# Patient Record
Sex: Male | Born: 1990 | Race: White | Hispanic: No | Marital: Single | Smoking: Never smoker
Health system: Southern US, Community
[De-identification: ages and names within clinical notes are randomized; demographics above are authoritative.]

## PROBLEM LIST (undated history)

## (undated) DIAGNOSIS — Z8619 Personal history of other infectious and parasitic diseases: Secondary | ICD-10-CM

## (undated) DIAGNOSIS — Z87442 Personal history of urinary calculi: Secondary | ICD-10-CM

## (undated) DIAGNOSIS — R51 Headache: Secondary | ICD-10-CM

## (undated) DIAGNOSIS — R519 Headache, unspecified: Secondary | ICD-10-CM

## (undated) DIAGNOSIS — G43909 Migraine, unspecified, not intractable, without status migrainosus: Secondary | ICD-10-CM

## (undated) HISTORY — DX: Headache: R51

## (undated) HISTORY — PX: APPENDECTOMY: SHX54

## (undated) HISTORY — DX: Migraine, unspecified, not intractable, without status migrainosus: G43.909

## (undated) HISTORY — DX: Personal history of urinary calculi: Z87.442

## (undated) HISTORY — DX: Personal history of other infectious and parasitic diseases: Z86.19

## (undated) HISTORY — DX: Headache, unspecified: R51.9

## (undated) HISTORY — PX: STOMACH SURGERY: SHX791

---
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
## 2014-10-16 ENCOUNTER — Emergency Department
Admission: EM | Admit: 2014-10-16 | Discharge: 2014-10-17 | Disposition: A | Discharge status: Home / Self Care | Payer: Self-pay | Attending: Emergency Medicine | Admitting: Emergency Medicine

## 2014-10-16 ENCOUNTER — Emergency Department: Payer: Self-pay

## 2014-10-16 DIAGNOSIS — X58XXXA Exposure to other specified factors, initial encounter: Secondary | ICD-10-CM | POA: Insufficient documentation

## 2014-10-16 DIAGNOSIS — Y998 Other external cause status: Secondary | ICD-10-CM | POA: Insufficient documentation

## 2014-10-16 DIAGNOSIS — S8391XA Sprain of unspecified site of right knee, initial encounter: Secondary | ICD-10-CM | POA: Insufficient documentation

## 2014-10-16 DIAGNOSIS — Y9289 Other specified places as the place of occurrence of the external cause: Secondary | ICD-10-CM | POA: Insufficient documentation

## 2014-10-16 DIAGNOSIS — Y9364 Activity, baseball: Secondary | ICD-10-CM | POA: Insufficient documentation

## 2014-10-16 MED ORDER — TRAMADOL HCL 50 MG PO TABS: 50.0000 mg | ORAL_TABLET | Freq: Four times a day (QID) | ORAL | Status: DC | PRN

## 2014-10-16 MED ORDER — IBUPROFEN 800 MG PO TABS: 800.0000 mg | ORAL_TABLET | Freq: Three times a day (TID) | ORAL | Status: DC | PRN

## 2014-10-16 NOTE — Discharge Instructions (Signed)
We Knee Bracing Knee braces are supports to help stabilize and protect an injured or painful knee. They come in many different styles. They should support and protect the knee without increasing the chance of other injuries to yourself or others. It is important not to have a false sense of security when using a brace. Knee braces that help you to keep using your knee:  Do not restore normal knee stability under high stress forces.  May decrease some aspects of athletic performance. Some of the different types of knee braces are:  Prophylactic knee braces are designed to prevent or reduce the severity of knee injuries during sports that make injury to the knee more likely.  Rehabilitative knee braces are designed to allow protected motion of:  Injured knees.  Knees that have been treated with or without surgery. There is no evidence that the use of a supportive knee brace protects the graft following a successful anterior cruciate ligament (ACL) reconstruction. However, braces are sometimes used to:   Protect injured ligaments.  Control knee movement during the initial healing period. They may be used as part of the treatment program for the various injured ligaments or cartilage of the knee including the:  Anterior cruciate ligament.  Medial collateral ligament.  Medial or lateral cartilage (meniscus).  Posterior cruciate ligament.  Lateral collateral ligament. Rehabilitative knee braces are most commonly used:  During crutch-assisted walking right after injury.  During crutch-assisted walking right after surgery to repair the cartilage and/or cruciate ligament injury.  For a short period of time, 2-8 weeks, after the injury or surgery. The value of a rehabilitative brace as opposed to a cast or splint includes the:  Ability to adjust the brace for swelling.  Ability to remove the brace for examinations, icing, or showering.  Ability to allow for movement in a controlled  range of motion. Functional knee braces give support to knees that have already been injured. They are designed to provide stability for the injured knee and provide protection after repair. Functional knee braces may not affect performance much. Lower extremity muscle strengthening, flexibility, and improvement in technique are more important than bracing in treating ligamentous knee injuries. Functional braces are not a substitute for rehabilitation or surgical procedures. Unloader/off-loader braces are designed to provide pain relief in arthritic knees. Patients with wear and tear arthritis from growing old or from an old cartilage injury (osteoarthritis) of the knee, and bowlegged (varus) or knock-knee (valgus) deformities, often develop increased pain in the arthritic side due to increased loading. Unloader/off-loader braces are made to reduce uneven loading in such knees. There is reduction in bowing out movement in bowlegged knees when the correct unloader brace is used. Patients with advanced osteoarthritis or severe varus or valgus alignment problems would not likely benefit from bracing. Patellofemoral braces help the kneecap to move smoothly and well centered over the end of the femur in the knee.  Most people who wear knee braces feel that they help. However, there is a lack of scientific evidence that knee braces are helpful at the level needed for athletic participation to prevent injury. In spite of this, athletes report an increase in knee stability, pain relief, performance improvement, and confidence during athletics when using a brace.  Different knee problems require different knee braces:  Your caregiver may suggest one kind of knee brace after knee surgery.  A caregiver may choose another kind of knee brace for support instead of surgery for some types of torn ligaments.  You  may also need one for pain in the front of your knee that is not getting better with strengthening and  flexibility exercises. Get your caregiver's advice if you want to try a knee brace. The caregiver will advise you on where to get them and provide a prescription when it is needed to fashion and/or fit the brace. Knee braces are the least important part of preventing knee injuries or getting better following injury. Stretching, strengthening and technique improvement are far more important in caring for and preventing knee injuries. When strengthening your knee, increase your activities a little at a time so as not to develop injuries from overuse. Work out an exercise plan with your caregiver and/or physical therapist to get the best program for you. Do not let a knee brace become a crutch. Always remember, there are no braces which support the knee as well as your original ligaments and cartilage you were born with. Conditioning, proper warm-up, and stretching remain the most important parts of keeping your knees healthy. HOW TO USE A KNEE BRACE  During sports, knee braces should be used as directed by your caregiver.  Make sure that the hinges are where the knee bends.  Straps, tapes, or hook-and-loop tapes should be fastened around your leg as instructed.  You should check the placement of the brace during activities to make sure that it has not moved. Poorly positioned braces can hurt rather than help you.  To work well, a knee brace should be worn during all activities that put you at risk of knee injury.  Warm up properly before beginning athletic activities. HOME CARE INSTRUCTIONS  Knee braces often get damaged during normal use. Replace worn-out braces for maximum benefit.  Clean regularly with soap and water.  Inspect your brace often for wear and tear.  Cover exposed metal to protect others from injury.  Durable materials may cost more, but last longer. SEEK IMMEDIATE MEDICAL CARE IF:   Your knee seems to be getting worse rather than better.  You have increasing pain or  swelling in the knee.  You have problems caused by the knee brace.  You have increased swelling or inflammation (redness or soreness) in your knee.  Your knee becomes warm and more painful and you develop an unexplained temperature over 101F (38.3C). MAKE SURE YOU:   Understand these instructions.  Will watch your condition.  Will get help right away if you are not doing well or get worse. See your caregiver, physical therapist, or orthopedic surgeon for additional information. Document Released: 07/14/2003 Document Revised: 09/07/2013 Document Reviewed: 10/20/2008 [facility] Patient Information 2015 [place], [place]. This information is not intended to replace advice given to you by your health care provider. Make sure you discuss any questions you have with your health care provider. ar knee support for 3-5 days as needed.

## 2014-10-16 NOTE — ED Notes (Signed)
Pt reports twisting knee while playing softball tonight. Reports feeling pop in knee. Difficulty ambulating due to pain.

## 2014-10-16 NOTE — ED Provider Notes (Signed)
[facility] Emergency Department Provider Note  ____________________________________________  Time seen: Approximately 10:33 PM  I have reviewed the triage vital signs and the nursing notes.   HISTORY  Chief Complaint Knee Injury    HPI Arthur Mills is a 24 y.o. male complaining of right knee pain secondary to a twisting incident while playing softball today. He stated he felt a popping when he twisted his knee. Patient states pain increases with ambulation. Patient states he is having trouble wiggling his toes. Patient is rating his pain as 8/10. Patient has applied ice but the pain but no other palliative measures. Incident occurred approximately one hour ago.   No past medical history on file.  There are no active problems to display for this patient.   No past surgical history on file.  Current Outpatient Rx  Name  Route  Sig  Dispense  Refill  . ibuprofen (ADVIL,MOTRIN) 800 MG tablet   Oral   Take 1 tablet (800 mg total) by mouth every 8 (eight) hours as needed for moderate pain.   15 tablet   0   . traMADol (ULTRAM) 50 MG tablet   Oral   Take 1 tablet (50 mg total) by mouth every 6 (six) hours as needed for moderate pain.   12 tablet   0     Allergies Review of patient's allergies indicates no known allergies.  No family history on file.  Social History History  Substance Use Topics  . Smoking status: Not on file  . Smokeless tobacco: Not on file  . Alcohol Use: Not on file    Review of Systems Constitutional: No fever/chills Eyes: No visual changes. ENT: No sore throat. Cardiovascular: Denies chest pain. Respiratory: Denies shortness of breath. Gastrointestinal: No abdominal pain.  No nausea, no vomiting.  No diarrhea.  No constipation. Genitourinary: Negative for dysuria. Musculoskeletal: Lateral right knee pain Skin: Negative for rash. Neurological: Negative for headaches, focal weakness or numbness.  10-point ROS  otherwise negative.  ____________________________________________   PHYSICAL EXAM:  VITAL SIGNS: ED Triage Vitals  Enc Vitals Group     BP 10/16/14 2212 121/83 mmHg     Pulse Rate 10/16/14 2212 104     Resp 10/16/14 2212 18     Temp 10/16/14 2212 98.4 F (36.9 C)     Temp Source 10/16/14 2212 Oral     SpO2 10/16/14 2212 97 %     Weight 10/16/14 2212 210 lb (95.255 kg)     Height 10/16/14 2212  (1.88 m)     Head Cir --      Peak Flow --      Pain Score 10/16/14 2212 8     Pain Loc --      Pain Edu? --      Excl. in GC? --     Constitutional: Alert and oriented. Well appearing and in no acute distress. Eyes: Conjunctivae are normal. PERRL. EOMI. Head: Atraumatic. Nose: No congestion/rhinnorhea. Mouth/Throat: Mucous membranes are moist.  Oropharynx non-erythematous. Neck: No stridor. No deformity full nuchal range of motion nontender palpation. Hematological/Lymphatic/Immunilogical: No cervical lymphadenopathy. Cardiovascular: Normal rate, regular rhythm. Grossly normal heart sounds.  Good peripheral circulation of the right lower extremity. Respiratory: Normal respiratory effort.  No retractions. Lungs CTAB. Gastrointestinal: Soft and nontender. No distention. No abdominal bruits. No CVA tenderness. Musculoskeletal: No obvious deformity of the left knee no obvious edema moderate crepitus palpation neurovascular intact free nuchal range of motion nontender repair.  Neurologic:  Normal  speech and language. No gross focal neurologic deficits are appreciated. Speech is normal. Atypical gait secondary to pain.  Skin:  Skin is warm, dry and intact. No rash noted. Psychiatric: Mood and affect are normal. Speech and behavior are normal.  ____________________________________________   LABS (all labs ordered are listed, but only abnormal results are displayed)  Labs Reviewed - No data to  display ____________________________________________  EKG   ____________________________________________  RADIOLOGY  No obvious deformity or fracture ____________________________________________   PROCEDURES  Procedure(s) performed: None  Critical Care performed: No  ____________________________________________   INITIAL IMPRESSION / ASSESSMENT AND PLAN / ED COURSE  Pertinent labs & imaging results that were available during my care of the patient were reviewed by me and considered in my medical decision making (see chart for details).  Sprain right knee. ____________________________________________   FINAL CLINICAL IMPRESSION(S) / ED DIAGNOSES  Final diagnoses:  Sprain, knee, right, initial encounter      Joni Reining, PA-C 10/16/14 2343  Phineas Semen, MD 10/18/14 [phone]

## 2014-10-17 MED ORDER — PHENYLEPHRINE HCL 0.5 % NA SOLN
NASAL | Status: AC
Start: 2014-10-17 — End: 2014-10-17
  Filled 2014-10-17: qty 15

## 2015-06-30 ENCOUNTER — Telehealth: Payer: Self-pay | Admitting: *Deleted

## 2015-06-30 ENCOUNTER — Encounter: Payer: Self-pay | Admitting: *Deleted

## 2015-06-30 NOTE — Telephone Encounter (Signed)
Pre-Visit Call completed with patient and chart updated.   Pre-Visit Info documented in Specialty Comments under [employer].    

## 2015-07-01 ENCOUNTER — Encounter: Payer: Self-pay | Admitting: Physician Assistant

## 2015-07-01 ENCOUNTER — Ambulatory Visit (INDEPENDENT_AMBULATORY_CARE_PROVIDER_SITE_OTHER): Payer: Managed Care, Other (non HMO) | Admitting: Physician Assistant

## 2015-07-01 VITALS — BP 124/60 | HR 117 | Temp 98.2°F | Ht 73.0 in | Wt 214.6 lb

## 2015-07-01 DIAGNOSIS — G8929 Other chronic pain: Secondary | ICD-10-CM | POA: Diagnosis not present

## 2015-07-01 DIAGNOSIS — Z23 Encounter for immunization: Secondary | ICD-10-CM | POA: Diagnosis not present

## 2015-07-01 DIAGNOSIS — M549 Dorsalgia, unspecified: Secondary | ICD-10-CM | POA: Diagnosis not present

## 2015-07-01 DIAGNOSIS — G43709 Chronic migraine without aura, not intractable, without status migrainosus: Secondary | ICD-10-CM

## 2015-07-01 DIAGNOSIS — R000 Tachycardia, unspecified: Secondary | ICD-10-CM

## 2015-07-01 LAB — CBC
HEMATOCRIT: 43.2 % (ref 39.0–52.0)
HEMOGLOBIN: 14.4 g/dL (ref 13.0–17.0)
MCHC: 33.3 g/dL (ref 30.0–36.0)
MCV: 85 fl (ref 78.0–100.0)
PLATELETS: 262 10*3/uL (ref 150.0–400.0)
RBC: 5.08 Mil/uL (ref 4.22–5.81)
RDW: 13.6 % (ref 11.5–15.5)
WBC: 7.5 10*3/uL (ref 4.0–10.5)

## 2015-07-01 LAB — TSH: TSH: 0.97 u[IU]/mL (ref 0.35–4.50)

## 2015-07-01 MED ORDER — TRAMADOL HCL (ER BIPHASIC) 100 MG PO TB24: ORAL_TABLET | ORAL | Status: DC

## 2015-07-01 NOTE — Progress Notes (Signed)
Pre visit review using our clinic review tool, if applicable. No additional management support is needed unless otherwise documented below in the visit note. 

## 2015-07-01 NOTE — Progress Notes (Signed)
Patient presents to clinic today to establish care.  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 Was previously evaluated by Orthopedics who recommended surgery. Patient endorses pain daily, ranked 7/10. Pain does radiate. Denies saddle anesthesia, numbness/tingling/weakness of legs.  Is not taking anything for pain. Would like to be set up with Orthopedics again.  Patient endorses history of migraine headaches described as mild, occurring 1-2 x week since high school after sustaining multiple concussions. Endorses taking OTC migraine medication with relief of symptoms. Denies every taking prescription medication for this issue.  Past Medical History  Diagnosis Date  . History of chicken pox   . Frequent headaches   . History of kidney stones 2012  . Migraine     No current outpatient prescriptions on file prior to visit.   No current facility-administered medications on file prior to visit.    No Known Allergies  Family History  Problem Relation Age of Onset  . Diabetes Paternal Grandmother   . Hypertension Paternal Grandmother   . Stroke Paternal Grandmother   . Alcohol abuse Maternal Uncle     Social History   Social History  . Marital Status: Single    Spouse Name: N/A  . Number of Children: 0  . Years of Education: N/A   Occupational History  . Construction    Social History Main Topics  . Smoking status: Never Smoker   . Smokeless tobacco: Current User    Types: Chew  . Alcohol Use: 0.0 oz/week    0 Standard drinks or equivalent per week     Comment: Occa  . Drug Use: No  . Sexual Activity:    Partners: Female   Other Topics Concern  . None   Social History Narrative   Review of Systems  Constitutional: Negative for fever.  Eyes: Negative for blurred vision and double vision.  Respiratory: Negative for cough and shortness of breath.   Cardiovascular: Negative for chest pain and palpitations.    Musculoskeletal: Positive for back pain and joint pain. Negative for falls.  Neurological: Negative for dizziness, sensory change and loss of consciousness.  Psychiatric/Behavioral: Negative for depression, suicidal ideas, hallucinations and substance abuse. The patient is not nervous/anxious and does not have insomnia.     BP 124/60 mmHg  Pulse 117  Temp(Src) 98.2 F (36.8 C) (Oral)  Ht  (1.854 m)  Wt 214 lb 9.6 oz (97.342 kg)  BMI 28.32 kg/m2  SpO2 98%  Physical Exam  Constitutional: He is oriented to person, place, and time and well-developed, well-nourished, and in no distress.  HENT:  Head: Normocephalic and atraumatic.  Right Ear: External ear normal.  Left Ear: External ear normal.  Nose: Nose normal.  Mouth/Throat: Oropharynx is clear and moist. No oropharyngeal exudate.  TM within normal limits bilaterally  Eyes: Conjunctivae are normal. Pupils are equal, round, and reactive to light.  Neck: Neck supple. No thyromegaly present.  Cardiovascular: Regular rhythm, normal heart sounds and intact distal pulses.   Slightly tachycardic  Pulmonary/Chest: Effort normal and breath sounds normal. No respiratory distress. He has no wheezes. He has no rales. He exhibits no tenderness.  Musculoskeletal:       Cervical back: Normal.       Thoracic back: He exhibits pain. He exhibits normal range of motion, no tenderness, no bony tenderness and no spasm.       Lumbar back: Normal.  Neurological: He is alert and oriented to person,  place, and time.  Skin: Skin is warm and dry. No rash noted.  Psychiatric: Affect normal.  Vitals reviewed.   Assessment/Plan: Chronic migraine without aura without status migrainosus, not intractable 1-2 per week. Mild. Discussed preventative medication giving chronicity of symptoms along with history of mild elevated heart rate. Patient will give some thought to this.  Chronic back pain Referral to Orthopedic surgery placed. Patient to get copies  of old records. Rx Tramadol ER to take daily. Follow-up as scheduled.  Tachycardia Suspect secondary to pain and anxiety at first visit. Asymptomatic. Will check TSH today.

## 2015-07-01 NOTE — Patient Instructions (Signed)
Please go to the lab for blood work. I will call you with your results.  Please take the Tramadol ER daily as directed. You will be contacted for an appointment with Orthopedics.  Please consider letting us start a medication like Propranolol for your migraines to prevent them.  Follow-up 1 month.

## 2015-07-03 DIAGNOSIS — G43709 Chronic migraine without aura, not intractable, without status migrainosus: Secondary | ICD-10-CM | POA: Insufficient documentation

## 2015-07-03 DIAGNOSIS — R000 Tachycardia, unspecified: Secondary | ICD-10-CM | POA: Insufficient documentation

## 2015-07-03 DIAGNOSIS — M549 Dorsalgia, unspecified: Principal | ICD-10-CM

## 2015-07-03 DIAGNOSIS — G8929 Other chronic pain: Secondary | ICD-10-CM | POA: Insufficient documentation

## 2015-07-03 NOTE — Assessment & Plan Note (Signed)
Suspect secondary to pain and anxiety at first visit. Asymptomatic. Will check TSH today.

## 2015-07-03 NOTE — Assessment & Plan Note (Signed)
1-2 per week. Mild. Discussed preventative medication giving chronicity of symptoms along with history of mild elevated heart rate. Patient will give some thought to this.

## 2015-07-03 NOTE — Assessment & Plan Note (Signed)
Referral to Orthopedic surgery placed. Patient to get copies of old records. Rx Tramadol ER to take daily. Follow-up as scheduled.

## 2015-07-04 ENCOUNTER — Encounter: Payer: Self-pay | Admitting: *Deleted

## 2015-07-29 ENCOUNTER — Telehealth: Payer: Self-pay | Admitting: Physician Assistant

## 2015-07-29 ENCOUNTER — Ambulatory Visit: Payer: Managed Care, Other (non HMO) | Admitting: Physician Assistant

## 2015-08-02 ENCOUNTER — Encounter: Payer: Self-pay | Admitting: Physician Assistant

## 2015-08-02 NOTE — Telephone Encounter (Signed)
No charge for 1st no-show 

## 2015-08-02 NOTE — Telephone Encounter (Signed)
Waiving fee, mailing reminder letter °

## 2015-08-02 NOTE — Telephone Encounter (Signed)
Pt was no show 07/29/15 for follow up appt, pt has rescheduled for 08/05/15, Marj do you remember if he gave a reason for no show? 1st no show Charge or no charge?

## 2015-08-05 ENCOUNTER — Ambulatory Visit (INDEPENDENT_AMBULATORY_CARE_PROVIDER_SITE_OTHER): Payer: Managed Care, Other (non HMO) | Admitting: Physician Assistant

## 2015-08-05 ENCOUNTER — Encounter: Payer: Self-pay | Admitting: Physician Assistant

## 2015-08-05 VITALS — BP 100/70 | HR 77 | Temp 98.7°F | Ht 73.0 in | Wt 215.4 lb

## 2015-08-05 DIAGNOSIS — G8929 Other chronic pain: Secondary | ICD-10-CM

## 2015-08-05 DIAGNOSIS — M549 Dorsalgia, unspecified: Secondary | ICD-10-CM | POA: Diagnosis not present

## 2015-08-05 MED ORDER — MELOXICAM 15 MG PO TABS: 15.0000 mg | ORAL_TABLET | Freq: Every day | ORAL | Status: DC

## 2015-08-05 NOTE — Progress Notes (Signed)
Patient presents to clinic today for follow-up of low back pain. Has seen Orthopedics this morning. Endorses having x-ray obtained that was negative for arthritic changes but specialist is worried about a herniated disc. Is scheduled for MRI next week. Endorses trying to take Tramadol as directed without any relief in symptoms. Has stopped taking. Denies new or worsening symptoms. Pain is a 4/10 currently.  Past Medical History  Diagnosis Date  . History of chicken pox   . Frequent headaches   . History of kidney stones 2012  . Migraine     No current outpatient prescriptions on file prior to visit.   No current facility-administered medications on file prior to visit.    No Known Allergies  Family History  Problem Relation Age of Onset  . Diabetes Paternal Grandmother   . Hypertension Paternal Grandmother   . Stroke Paternal Grandmother   . Alcohol abuse Maternal Uncle     Social History   Social History  . Marital Status: Single    Spouse Name: N/A  . Number of Children: 0  . Years of Education: N/A   Occupational History  . Construction    Social History Main Topics  . Smoking status: Never Smoker   . Smokeless tobacco: Current User    Types: Chew  . Alcohol Use: 0.0 oz/week    0 Standard drinks or equivalent per week     Comment: Occa  . Drug Use: No  . Sexual Activity:    Partners: Female   Other Topics Concern  . None   Social History Narrative    Review of Systems - See HPI.  All other ROS are negative.  BP 100/70 mmHg  Pulse 77  Temp(Src) 98.7 F (37.1 C) (Oral)  Ht  (1.854 m)  Wt 215 lb 6.4 oz (97.705 kg)  BMI 28.42 kg/m2  SpO2 98%  Physical Exam  Constitutional: He is oriented to person, place, and time and well-developed, well-nourished, and in no distress.  HENT:  Head: Normocephalic and atraumatic.  Eyes: Conjunctivae are normal.  Cardiovascular: Normal rate, regular rhythm, normal heart sounds and intact distal pulses.     Pulmonary/Chest: Effort normal and breath sounds normal. No respiratory distress. He has no wheezes. He has no rales. He exhibits no tenderness.  Musculoskeletal:       Lumbar back: He exhibits tenderness. He exhibits no bony tenderness.  Neurological: He is alert and oriented to person, place, and time.  Skin: Skin is warm and dry. No rash noted.  Psychiatric: Affect normal.  Vitals reviewed.   Recent Results (from the past 2160 hour(s))  TSH     Status: None   Collection Time: 07/01/15  1:56 PM  Result Value Ref Range   TSH 0.97 0.35 - 4.50 uIU/mL  CBC     Status: None   Collection Time: 07/01/15  1:56 PM  Result Value Ref Range   WBC 7.5 4.0 - 10.5 K/uL   RBC 5.08 4.22 - 5.81 Mil/uL   Platelets 262.0 150.0 - 400.0 K/uL   Hemoglobin 14.4 13.0 - 17.0 g/dL   HCT 69.6 29.5 - 28.4 %   MCV 85.0 78.0 - 100.0 fl   MCHC 33.3 30.0 - 36.0 g/dL   RDW 13.2 44.0 - 10.2 %    Assessment/Plan: Chronic back pain Follow-up with Orthopedics as scheduled for MRI and further management. Will attempt trial of Meloxicam to see if NSAID offers better relief of pain as patient wanting to avoid  use of Norco.

## 2015-08-05 NOTE — Patient Instructions (Signed)
Please take the Meloxicam once daily as directed to see if this helps with pain. Follow-up with Orthopedics next week as scheduled for an MRI.  Follow-up with me as needed.

## 2015-08-05 NOTE — Assessment & Plan Note (Signed)
Follow-up with Orthopedics as scheduled for MRI and further management. Will attempt trial of Meloxicam to see if NSAID offers better relief of pain as patient wanting to avoid use of Norco.

## 2015-08-05 NOTE — Progress Notes (Signed)
Pre visit review using our clinic review tool, if applicable. No additional management support is needed unless otherwise documented below in the visit note. 

## 2015-09-14 ENCOUNTER — Encounter (HOSPITAL_BASED_OUTPATIENT_CLINIC_OR_DEPARTMENT_OTHER): Payer: Self-pay | Admitting: Emergency Medicine

## 2015-09-14 ENCOUNTER — Emergency Department (HOSPITAL_BASED_OUTPATIENT_CLINIC_OR_DEPARTMENT_OTHER)
Admission: EM | Admit: 2015-09-14 | Discharge: 2015-09-14 | Disposition: A | Discharge status: Home / Self Care | Payer: Managed Care, Other (non HMO) | Attending: Emergency Medicine | Admitting: Emergency Medicine

## 2015-09-14 DIAGNOSIS — Y929 Unspecified place or not applicable: Secondary | ICD-10-CM | POA: Insufficient documentation

## 2015-09-14 DIAGNOSIS — W298XXA Contact with other powered powered hand tools and household machinery, initial encounter: Secondary | ICD-10-CM | POA: Insufficient documentation

## 2015-09-14 DIAGNOSIS — Y9389 Activity, other specified: Secondary | ICD-10-CM | POA: Insufficient documentation

## 2015-09-14 DIAGNOSIS — Z720 Tobacco use: Secondary | ICD-10-CM | POA: Insufficient documentation

## 2015-09-14 DIAGNOSIS — S61219A Laceration without foreign body of unspecified finger without damage to nail, initial encounter: Secondary | ICD-10-CM

## 2015-09-14 DIAGNOSIS — S61212A Laceration without foreign body of right middle finger without damage to nail, initial encounter: Secondary | ICD-10-CM | POA: Insufficient documentation

## 2015-09-14 DIAGNOSIS — Y999 Unspecified external cause status: Secondary | ICD-10-CM | POA: Insufficient documentation

## 2015-09-14 MED ORDER — LIDOCAINE HCL 2 % IJ SOLN
20.0000 mL | Freq: Once | INTRAMUSCULAR | Status: DC
  Filled 2015-09-14: qty 20

## 2015-09-14 NOTE — ED Notes (Signed)
Patient reports that he cut his right middle finger on a drill

## 2015-09-14 NOTE — ED Notes (Signed)
Patient seen and treated by PA - wound is sutured prior to this RN's ability to assess.

## 2015-09-14 NOTE — ED Provider Notes (Signed)
CSN: [id number]     Arrival date & time 09/14/15  1312 History   First MD Initiated Contact with Patient 09/14/15 1404     Chief Complaint  Patient presents with  . Finger Injury     (Consider location/radiation/quality/duration/timing/severity/associated sxs/prior Treatment) HPI Comments: Patient presents with complaint of laceration to the right long finger sustained while working today. Patient was using a drill with this finger and reports that the skin tore back. Patient rinsed the wound and applied a dressing with gauze and duct tape. He reports some bleeding at the time of injury. Last tetanus was 4 years ago. Patient denies other injury. Onset of symptoms acute. Course is constant. Nothing makes symptoms better or worse.  The history is provided by the patient.    Past Medical History  Diagnosis Date  . History of chicken pox   . Frequent headaches   . History of kidney stones 2012  . Migraine    Past Surgical History  Procedure Laterality Date  . Stomach surgery  "middle school'    repair of "malrotation of stomach"  . Appendectomy     Family History  Problem Relation Age of Onset  . Diabetes Paternal Grandmother   . Hypertension Paternal Grandmother   . Stroke Paternal Grandmother   . Alcohol abuse Maternal Uncle    Social History  Substance Use Topics  . Smoking status: Never Smoker   . Smokeless tobacco: Current User    Types: Chew  . Alcohol Use: 0.0 oz/week    0 Standard drinks or equivalent per week     Comment: Occa    Review of Systems  Constitutional: Negative for activity change.  Musculoskeletal: Negative for back pain, joint swelling, arthralgias, gait problem and neck pain.  Skin: Positive for wound.  Neurological: Negative for weakness and numbness.      Allergies  Review of patient's allergies indicates no known allergies.  Home Medications   Prior to Admission medications   Medication Sig Start Date End Date Taking? Authorizing  Provider  meloxicam (MOBIC) 15 MG tablet Take 1 tablet (15 mg total) by mouth daily. 08/05/15   Waldon MerlWilliam C Martin, PA-C   BP 142/71 mmHg  Pulse 94  Temp(Src) 98.6 F (37 C) (Oral)  Resp 18  Ht 6\' 2"  (1.88 m)  Wt 92.987 kg  BMI 26.31 kg/m2  SpO2 100% Physical Exam  Constitutional: He appears well-developed and well-nourished.  HENT:  Head: Normocephalic and atraumatic.  Eyes: Conjunctivae are normal.  Neck: Normal range of motion. Neck supple.  Cardiovascular: Normal pulses.   Musculoskeletal: He exhibits tenderness. He exhibits no edema.       Right hand: He exhibits normal range of motion and no tenderness. Normal sensation noted.       Hands: Patient with a 2 cm total length laceration to the pad of the right long finger. Wound is C-shaped. Skin flap appears to be perfused. Wound explored and flap contains epidermis only. Base of wound clean without significant bleeding. No foreign bodies noted. Patient able to fully flex finger at the DIP and PIP joints.  Neurological: He is alert. No sensory deficit.  Motor, sensation, and vascular distal to the injury is fully intact.   Skin: Skin is warm and dry.  Psychiatric: He has a normal mood and affect.  Nursing note and vitals reviewed.   ED Course  Procedures (including critical care time)  Patient seen and examined.   Vital signs reviewed and are as follows: BP 142/71  mmHg  Pulse 94  Temp(Src) 98.6 F (37 C) (Oral)  Resp 18  Ht  (1.88 m)  Wt 92.987 kg  BMI 26.31 kg/m2  SpO2 100%  Digital block performed Technique: single injection into base of finger into flexor tendon sheath Finger: Right long Area prepped with alcohol wipe and iodine Medication: mL of 2% lidocaine without epinephrine Patient tolerated procedure well. Adequate anesthesia achieved.   LACERATION REPAIR Performed by: Carolee Rota, Horcher PA-S2 Authorized by: Carolee Rota Consent: Verbal consent obtained. Risks and benefits: risks,  benefits and alternatives were discussed Consent given by: patient Patient identity confirmed: provided demographic data Prepped and Draped in normal sterile fashion Wound explored  Laceration Location: R long  Laceration Length: 2cm, c-shaped flap  No Foreign Bodies seen or palpated  Anesthesia: digital block (see above)  Irrigation method: skin scrub Amount of cleaning: standard  Skin closure: 6-0 Ethilon  Number of sutures: 7  Technique: simple interrupted  Patient tolerance: Patient tolerated the procedure well with no immediate complications.   Patient counseled on wound care. Patient counseled on need to return or see PCP/urgent care for suture removal in 7 days. Patient was urged to return to the Emergency Department urgently with worsening pain, swelling, expanding erythema especially if it streaks away from the affected area, fever, or if they have any other concerns. Patient verbalized understanding.    MDM   Final diagnoses:  Finger laceration, initial encounter   Finger lac: Flap laceration involving epidermis. Wound flap tacked down to protect area. Unclear if this area will eventually need to be debrided. No signs of infection at this point. Wound is clean without concern for foreign body. No flexor tendon involvement is suspected.   Renne Crigler, PA-C 09/14/15 1646  Vanetta Mulders, MD 09/20/15 [phone]

## 2015-09-14 NOTE — Discharge Instructions (Signed)
Please read and follow all provided instructions.  Your diagnoses today include:  1. Finger laceration, initial encounter    Tests performed today include:  Vital signs. See below for your results today.   Medications prescribed:   None  Take any prescribed medications only as directed.   Home care instructions:  Follow any educational materials and wound care instructions contained in this packet.   Keep affected area above the level of your heart when possible to minimize swelling. Wash area gently twice a day with warm soapy water. Do not apply alcohol or hydrogen peroxide. Cover the area if it draining or weeping.   Follow-up instructions: Suture Removal: Return to the Emergency Department or see your primary care care doctor in 7 days for a recheck of your wound and removal of your sutures or staples.    Return instructions:  Return to the Emergency Department if you have:  Fever  Worsening pain  Worsening swelling of the wound  Pus draining from the wound  Redness of the skin that moves away from the wound, especially if it streaks away from the affected area   Any other emergent concerns  Your vital signs today were: BP 142/71 mmHg   Pulse 94   Temp(Src) 98.6 F (37 C) (Oral)   Resp 18   Ht 6\' 2"  (1.88 m)   Wt 92.987 kg   BMI 26.31 kg/m2   SpO2 100% If your blood pressure (BP) was elevated above 135/85 this visit, please have this repeated by your doctor within one month. --------------

## 2015-09-14 NOTE — ED Notes (Signed)
Finger splint applied, pateint with good CMS post application. The patient given instructions about splint management and wound care.

## 2016-02-16 ENCOUNTER — Emergency Department (HOSPITAL_BASED_OUTPATIENT_CLINIC_OR_DEPARTMENT_OTHER)
Admission: EM | Admit: 2016-02-16 | Discharge: 2016-02-16 | Disposition: A | Discharge status: Home / Self Care | Payer: 59 | Attending: Emergency Medicine | Admitting: Emergency Medicine

## 2016-02-16 ENCOUNTER — Encounter (HOSPITAL_BASED_OUTPATIENT_CLINIC_OR_DEPARTMENT_OTHER): Payer: Self-pay

## 2016-02-16 ENCOUNTER — Emergency Department (HOSPITAL_BASED_OUTPATIENT_CLINIC_OR_DEPARTMENT_OTHER): Payer: 59

## 2016-02-16 DIAGNOSIS — J029 Acute pharyngitis, unspecified: Secondary | ICD-10-CM | POA: Diagnosis present

## 2016-02-16 DIAGNOSIS — J36 Peritonsillar abscess: Secondary | ICD-10-CM | POA: Insufficient documentation

## 2016-02-16 DIAGNOSIS — F17220 Nicotine dependence, chewing tobacco, uncomplicated: Secondary | ICD-10-CM | POA: Insufficient documentation

## 2016-02-16 LAB — I-STAT CHEM 8, ED
BUN: 11 mg/dL (ref 6–20)
CHLORIDE: 98 mmol/L — AB (ref 101–111)
CREATININE: 1.1 mg/dL (ref 0.61–1.24)
Calcium, Ion: 1.18 mmol/L (ref 1.15–1.40)
GLUCOSE: 92 mg/dL (ref 65–99)
HCT: 46 % (ref 39.0–52.0)
HEMOGLOBIN: 15.6 g/dL (ref 13.0–17.0)
Potassium: 4.2 mmol/L (ref 3.5–5.1)
Sodium: 137 mmol/L (ref 135–145)
TCO2: 27 mmol/L (ref 0–100)

## 2016-02-16 LAB — CBC WITH DIFFERENTIAL/PLATELET
Basophils Absolute: 0 10*3/uL (ref 0.0–0.1)
Basophils Relative: 0 %
EOS PCT: 0 %
Eosinophils Absolute: 0 10*3/uL (ref 0.0–0.7)
HCT: 42.7 % (ref 39.0–52.0)
Hemoglobin: 14 g/dL (ref 13.0–17.0)
LYMPHS PCT: 10 %
Lymphs Abs: 1.4 10*3/uL (ref 0.7–4.0)
MCH: 29 pg (ref 26.0–34.0)
MCHC: 32.8 g/dL (ref 30.0–36.0)
MCV: 88.4 fL (ref 78.0–100.0)
MONO ABS: 1.8 10*3/uL — AB (ref 0.1–1.0)
MONOS PCT: 13 %
NEUTROS ABS: 11.1 10*3/uL — AB (ref 1.7–7.7)
Neutrophils Relative %: 77 %
PLATELETS: 279 10*3/uL (ref 150–400)
RBC: 4.83 MIL/uL (ref 4.22–5.81)
RDW: 12.9 % (ref 11.5–15.5)
WBC: 14.3 10*3/uL — ABNORMAL HIGH (ref 4.0–10.5)

## 2016-02-16 MED ORDER — ONDANSETRON HCL 4 MG/2ML IJ SOLN
INTRAMUSCULAR | Status: AC
  Filled 2016-02-16: qty 2

## 2016-02-16 MED ORDER — ONDANSETRON HCL 4 MG/2ML IJ SOLN
4.0000 mg | Freq: Once | INTRAMUSCULAR | Status: AC
  Administered 2016-02-16: 4 mg via INTRAVENOUS

## 2016-02-16 MED ORDER — CLINDAMYCIN HCL 150 MG PO CAPS: 300.0000 mg | ORAL_CAPSULE | Freq: Four times a day (QID) | ORAL | 0 refills | Status: DC

## 2016-02-16 MED ORDER — DIPHENHYDRAMINE HCL 50 MG/ML IJ SOLN
INTRAMUSCULAR | Status: AC
  Filled 2016-02-16: qty 1

## 2016-02-16 MED ORDER — FAMOTIDINE IN NACL 20-0.9 MG/50ML-% IV SOLN
20.0000 mg | Freq: Once | INTRAVENOUS | Status: AC
  Administered 2016-02-16: 20 mg via INTRAVENOUS
  Filled 2016-02-16: qty 50

## 2016-02-16 MED ORDER — DEXAMETHASONE SODIUM PHOSPHATE 10 MG/ML IJ SOLN
10.0000 mg | Freq: Once | INTRAMUSCULAR | Status: AC
  Administered 2016-02-16: 10 mg via INTRAVENOUS
  Filled 2016-02-16: qty 1

## 2016-02-16 MED ORDER — SODIUM CHLORIDE 0.9 % IV BOLUS (SEPSIS)
1000.0000 mL | Freq: Once | INTRAVENOUS | Status: AC
  Administered 2016-02-16: 1000 mL via INTRAVENOUS

## 2016-02-16 MED ORDER — DIPHENHYDRAMINE HCL 50 MG/ML IJ SOLN
50.0000 mg | Freq: Once | INTRAMUSCULAR | Status: AC
  Administered 2016-02-16: 50 mg via INTRAVENOUS

## 2016-02-16 MED ORDER — CLINDAMYCIN PHOSPHATE 600 MG/50ML IV SOLN
600.0000 mg | Freq: Once | INTRAVENOUS | Status: AC
  Administered 2016-02-16: 600 mg via INTRAVENOUS
  Filled 2016-02-16: qty 50

## 2016-02-16 MED ORDER — IOPAMIDOL (ISOVUE-300) INJECTION 61%
100.0000 mL | Freq: Once | INTRAVENOUS | Status: AC | PRN
  Administered 2016-02-16: 75 mL via INTRAVENOUS

## 2016-02-16 NOTE — ED Triage Notes (Signed)
C/o sore throat x 5 days-NAD-steady gait 

## 2016-02-16 NOTE — ED Notes (Signed)
Patient transported to CT 

## 2016-02-16 NOTE — ED Notes (Signed)
Note for work and Rx for clindamycin given. Pt verbalizes understanding to go directly to Dr. Jenne PaneBates office and to remain NPO. Family present to drive. Pt ambulatory to d/c window with steady gait.

## 2016-02-16 NOTE — ED Notes (Signed)
This rn called to radiology by Fleet Contrasachel, radiology tech, pt vomiting, when this rn assesses pt redness and hives noted to back and arms. Pt denies any sob, taken directly back to room in ED and md alerted, rn at bedside with benadryl and zofran on arrival to room. Per Fleet Contrasachel, radiology tech, pt received 47cc isovue 300.

## 2016-02-16 NOTE — ED Notes (Signed)
Pt resting more comfortably now. sts feels better; denies significant change in throat swelling.

## 2016-02-16 NOTE — ED Provider Notes (Signed)
[facility]EMERGENCY DEPT Provider Note   CSN: [id number] Arrival date & time: 02/16/16  1135     History   Chief Complaint Chief Complaint  Patient presents with  . Sore Throat    HPI Arthur Mills is a 25 y.o. male.  The history is provided by the patient. No language interpreter was used.  Sore Throat    Arthur Mills is a 25 y.o. male who presents to the Emergency Department complaining of sore throat. He reports 4 days of sore throat on the right side and pain with swallowing. He has associated swelling to the right side of his face. He has night sweats but no reported fevers. No cough. He is able to swallow water and handle his secretions. No shortness of breath. Symptoms are moderate to severe, constant, worsening. He notes a change in his voice since yesterday. Past Medical History:  Diagnosis Date  . Frequent headaches   . History of chicken pox   . History of kidney stones 2012  . Migraine     Patient Active Problem List   Diagnosis Date Noted  . Chronic back pain 07/03/2015  . Tachycardia 07/03/2015  . Chronic migraine without aura without status migrainosus, not intractable 07/03/2015    Past Surgical History:  Procedure Laterality Date  . APPENDECTOMY    . STOMACH SURGERY  "middle school'   repair of "malrotation of stomach"       Home Medications    Prior to Admission medications   Medication Sig Start Date End Date Taking? Authorizing Provider  clindamycin (CLEOCIN) 150 MG capsule Take 2 capsules (300 mg total) by mouth 4 (four) times daily. 02/16/16   Tilden Fossa, MD    Family History Family History  Problem Relation Age of Onset  . Diabetes Paternal Grandmother   . Hypertension Paternal Grandmother   . Stroke Paternal Grandmother   . Alcohol abuse Maternal Uncle     Social History Social History  Substance Use Topics  . Smoking status: Never Smoker  . Smokeless tobacco: Current User    Types: Chew  . Alcohol use Yes     Comment:  occ     Allergies   Isovue [iopamidol]   Review of Systems Review of Systems  All other systems reviewed and are negative.    Physical Exam Updated Vital Signs BP 126/72   Pulse 97   Temp 99.2 F (37.3 C)   Resp 16   Ht 6\' 1"  (1.854 m)   Wt 215 lb (97.5 kg)   SpO2 100%   BMI 28.37 kg/m   Physical Exam  Constitutional: He is oriented to person, place, and time. He appears well-developed and well-nourished.  HENT:  Head: Normocephalic and atraumatic.  Right Ear: External ear normal.  Left Ear: External ear normal.  Erythema and edema to the posterior oropharynx, significantly to the right tonsillar pillar.  Neck: Neck supple.  Slightly muffled voice  Cardiovascular: Normal rate and regular rhythm.   No murmur heard. Pulmonary/Chest: Effort normal and breath sounds normal. No stridor. No respiratory distress.  Abdominal: Soft. There is no tenderness. There is no rebound and no guarding.  Musculoskeletal: He exhibits no edema or tenderness.  Lymphadenopathy:    He has cervical adenopathy.  Neurological: He is alert and oriented to person, place, and time.  Skin: Skin is warm and dry.  Psychiatric: He has a normal mood and affect. His behavior is normal.  Nursing note and vitals reviewed.    ED Treatments /  Results  Labs (all labs ordered are listed, but only abnormal results are displayed) Labs Reviewed  CBC WITH DIFFERENTIAL/PLATELET - Abnormal; Notable for the following:       Result Value   WBC 14.3 (*)    Neutro Abs 11.1 (*)    Monocytes Absolute 1.8 (*)    All other components within normal limits  I-STAT CHEM 8, ED - Abnormal; Notable for the following:    Chloride 98 (*)    All other components within normal limits    EKG  EKG Interpretation None       Radiology Ct Soft Tissue Neck Wo Contrast  Result Date: 02/16/2016 CLINICAL DATA:  Sore throat.  Fever and elevated white blood count. EXAM: CT NECK WITHOUT CONTRAST TECHNIQUE:  Multidetector CT imaging of the neck was performed following the standard protocol without intravenous contrast. COMPARISON:  None. FINDINGS: The patient received approximately 47 mL of Isovue-300 and immediately began vomiting and had hives. Emergency room was called to the patient was transported back to the ED for treatment of allergic reaction. In retrospect, the patient remembers having previous allergic reaction to intravenous contrast material. The patient was scanned one hour after contrast administration which time there is no significant intravascular contrast present. Pharynx and larynx: Enlargement of the right tonsil measuring approximately 3 cm. Without intravenous contrast, it is difficult determine if this is an abscess or phlegmon or tumor. Given the history this may represent a peritonsillar abscess. This has homogeneous density on unenhanced scanning. Left tonsil normal. Airway mildly deviated to the left without significant compromise. Base of tongue normal. Epiglottis and vocal cords normal. Proximal esophagus normal. Salivary glands: Negative Thyroid: Negative Lymph nodes: Multiple enlarged lymph nodes especially on the right. Right level 2 lymph nodes 17 mm and 16 mm. Posterior right lymph node 9 mm. Left level 2 lymph node 6 mm. Small posterior lymph nodes bilaterally. Vascular: Vascular patency not evaluated without intravenous contrast. Limited intracranial: Negative Visualized orbits: Negative Mastoids and visualized paranasal sinuses: Negative Skeleton: Negative Upper chest: Negative Other: None IMPRESSION: Enlargement of the right tonsil. Lack of intravenous contrast limits differential diagnosis. Given the history this may represent a peritonsillar abscess. Non liquified infection of the tonsil and tumor are in the differential. Enlarged right level 2 lymph nodes which may be related to tonsillar infection or less likely neoplasm given the history. The patient had an allergic reaction  to contrast and was not scanned for proximally 1 hour. This is essentially an unenhanced study. These results were called by telephone at the time of interpretation on 02/16/2016 at 2:39 pm to Dr. Tilden FossaELIZABETH Marty Uy , who verbally acknowledged these results. Electronically Signed   By: Marlan Palauharles  Clark M.D.   On: 02/16/2016 14:40    Procedures Procedures (including critical care time)  Medications Ordered in ED Medications  dexamethasone (DECADRON) injection 10 mg (10 mg Intravenous Given 02/16/16 0000)  clindamycin (CLEOCIN) IVPB 600 mg (0 mg Intravenous Stopped 02/16/16 1447)  sodium chloride 0.9 % bolus 1,000 mL (0 mLs Intravenous Stopped 02/16/16 1518)  iopamidol (ISOVUE-300) 61 % injection 100 mL (75 mLs Intravenous Contrast Given 02/16/16 1303)  ondansetron (ZOFRAN) injection 4 mg (4 mg Intravenous Given 02/16/16 1315)  diphenhydrAMINE (BENADRYL) injection 50 mg (50 mg Intravenous Given 02/16/16 1318)  famotidine (PEPCID) IVPB 20 mg premix (0 mg Intravenous Stopped 02/16/16 1409)     Initial Impression / Assessment and Plan / ED Course  I have reviewed the triage vital signs and the nursing notes.  Pertinent labs & imaging results that were available during my care of the patient were reviewed by me and considered in my medical decision making (see chart for details).  Clinical Course    Patient here for evaluation of sore throat. Examination is concerning for peritonsillar abscess. He does not have any stridor on examination and is able to handle his secretions. Attempted to image with CT soft tissue neck with IV contrast. Patient developed immediate nausea, vomiting and urticaria. He was treated with Benadryl, Pepcid. He received Decadron prior to the scan for his oral swelling. His nausea and vomiting quickly resolved and his urticaria continued to improve during his ED stay. Discussed with Dr. Jenne Pane with ENT who will see the patient in the office today for further treatment of his  peritonsillar abscess.  Final Clinical Impressions(s) / ED Diagnoses   Final diagnoses:  Peritonsillar abscess    New Prescriptions Discharge Medication List as of 02/16/2016  3:18 PM    START taking these medications   Details  clindamycin (CLEOCIN) 150 MG capsule Take 2 capsules (300 mg total) by mouth 4 (four) times daily., Starting Thu 02/16/2016, Print         Tilden Fossa, MD 02/17/16 [phone]

## 2016-02-16 NOTE — ED Notes (Signed)
MD at bedside. 

## 2017-02-18 ENCOUNTER — Emergency Department (HOSPITAL_BASED_OUTPATIENT_CLINIC_OR_DEPARTMENT_OTHER)
Admission: EM | Admit: 2017-02-18 | Discharge: 2017-02-18 | Disposition: A | Discharge status: Home / Self Care | Payer: 59 | Attending: Emergency Medicine | Admitting: Emergency Medicine

## 2017-02-18 ENCOUNTER — Encounter (HOSPITAL_BASED_OUTPATIENT_CLINIC_OR_DEPARTMENT_OTHER): Payer: Self-pay | Admitting: *Deleted

## 2017-02-18 DIAGNOSIS — F17220 Nicotine dependence, chewing tobacco, uncomplicated: Secondary | ICD-10-CM | POA: Insufficient documentation

## 2017-02-18 DIAGNOSIS — G8929 Other chronic pain: Secondary | ICD-10-CM | POA: Insufficient documentation

## 2017-02-18 DIAGNOSIS — M545 Low back pain: Secondary | ICD-10-CM | POA: Diagnosis present

## 2017-02-18 DIAGNOSIS — M5442 Lumbago with sciatica, left side: Secondary | ICD-10-CM | POA: Diagnosis not present

## 2017-02-18 MED ORDER — METHYLPREDNISOLONE 4 MG PO TBPK: ORAL_TABLET | ORAL | 0 refills | Status: DC

## 2017-02-18 MED ORDER — CYCLOBENZAPRINE HCL 10 MG PO TABS: 10.0000 mg | ORAL_TABLET | Freq: Two times a day (BID) | ORAL | 0 refills | Status: DC | PRN

## 2017-02-18 NOTE — Discharge Instructions (Signed)
Please read attached information regarding your condition and back exercises. Take steroids in tapered dose pack as directed. Take Flexeril as needed for spasms. Apply heat to affected area as directed. Follow-up with your PCP for further evaluation. Return to ED for worsening pain, numbness in legs, trouble with urination, fevers, injuries or falls.

## 2017-02-18 NOTE — ED Triage Notes (Signed)
Pt reports right side low back pain radiating into right leg since last Wed. Reports pain started after lifting at work

## 2017-02-18 NOTE — ED Provider Notes (Signed)
[facility] EMERGENCY DEPARTMENT Provider Note   CSN: [id number] Arrival date & time: 02/18/17  1736     History   Chief Complaint Chief Complaint  Patient presents with  . Back Pain    HPI Arthur Mills is a 26 y.o. male.  HPI  Patient presents to ED for evaluation of bilateral low back pain that began 5 days ago. He states that he was at work when he lifted a heavy box after which the pain began. He reports history of similar symptoms in the past due to muscle strain. He has tried ibuprofen with mild relief in symptoms. He reports no improvement in symptoms with heat or ice the area. He's been ambulatory since the incident. He reports that the pain worsened today and it is worse with movement. He denies story of cancer, history of IV drug use, urinary incontinence, dysuria, hematuria, fevers, numbness in legs. He does have a  History of kidney stones in the past but states that this does not feel like that.  Past Medical History:  Diagnosis Date  . Frequent headaches   . History of chicken pox   . History of kidney stones 2012  . Migraine     Patient Active Problem List   Diagnosis Date Noted  . Chronic back pain 07/03/2015  . Tachycardia 07/03/2015  . Chronic migraine without aura without status migrainosus, not intractable 07/03/2015    Past Surgical History:  Procedure Laterality Date  . APPENDECTOMY    . STOMACH SURGERY  "middle school'   repair of "malrotation of stomach"       Home Medications    Prior to Admission medications   Medication Sig Start Date End Date Taking? Authorizing Provider  clindamycin (CLEOCIN) 150 MG capsule Take 2 capsules (300 mg total) by mouth 4 (four) times daily. 02/16/16   Tilden Fossa, MD  cyclobenzaprine (FLEXERIL) 10 MG tablet Take 1 tablet (10 mg total) by mouth 2 (two) times daily as needed for muscle spasms. 02/18/17   Paislynn Hegstrom, PA-C  methylPREDNISolone (MEDROL DOSEPAK) 4 MG TBPK tablet Taper over 6 days.  02/18/17   Dietrich Pates, PA-C    Family History Family History  Problem Relation Age of Onset  . Diabetes Paternal Grandmother   . Hypertension Paternal Grandmother   . Stroke Paternal Grandmother   . Alcohol abuse Maternal Uncle     Social History Social History  Substance Use Topics  . Smoking status: Never Smoker  . Smokeless tobacco: Current User    Types: Chew  . Alcohol use Yes     Comment: occ     Allergies   Isovue [iopamidol]   Review of Systems Review of Systems  Constitutional: Negative for chills and fever.  Gastrointestinal: Negative for nausea and vomiting.  Genitourinary: Negative for dysuria, flank pain, hematuria and urgency.  Musculoskeletal: Positive for back pain. Negative for arthralgias, gait problem, joint swelling, neck pain and neck stiffness.  Skin: Negative for rash.  Neurological: Negative for weakness and numbness.     Physical Exam Updated Vital Signs BP 138/82 (BP Location: Left Arm)   Pulse 68   Temp 98.2 F (36.8 C) (Oral)   Resp 16   Ht  (1.854 m)   Wt 104.3 kg (230 lb)   SpO2 100%   BMI 30.34 kg/m   Physical Exam  Constitutional: He appears well-developed and well-nourished. No distress.  Nontoxic appearing and in no acute distress.  HENT:  Head: Normocephalic and atraumatic.  Eyes:  Conjunctivae and EOM are normal. No scleral icterus.  Neck: Normal range of motion.  Pulmonary/Chest: Effort normal. No respiratory distress.  Musculoskeletal: Normal range of motion. He exhibits tenderness. He exhibits no edema or deformity.       Arms: Tenderness to palpation of bilateral paraspinal musculature in the lumbar area as indicated. No midline spinal tenderness present in lumbar, thoracic or cervical spine. No step-off palpated. No visible bruising, edema or temperature change noted. No objective signs of numbness present. No saddle anesthesia. Sensation intact to light touch. Strength 5/5 in bilateral lower extremities.    Neurological: He is alert.  Skin: No rash noted. He is not diaphoretic.  Psychiatric: He has a normal mood and affect.  Nursing note and vitals reviewed.    ED Treatments / Results  Labs (all labs ordered are listed, but only abnormal results are displayed) Labs Reviewed - No data to display  EKG  EKG Interpretation None       Radiology No results found.  Procedures Procedures (including critical care time)  Medications Ordered in ED Medications - No data to display   Initial Impression / Assessment and Plan / ED Course  I have reviewed the triage vital signs and the nursing notes.  Pertinent labs & imaging results that were available during my care of the patient were reviewed by me and considered in my medical decision making (see chart for details).     Patient presents toED for evaluation of bilateral lower back pain for the past 5 days.Reports a history of similar symptoms it due to muscle strain. States that symptoms began after lifting a heavy box at work. He has tried ibuprofen with mild relief in symptoms. He denies any previous back surgery, history of cancer, history of IV drug use, fevers, numbness in legs, urinary incontinence or saddle anesthesia. On physical exam patient is nontoxic-appearing and in no acute distress. He does have bilateral paraspinal muscular tenderness to palpation noted in the lumbar area with no midline tenderness present. Sensation intact to light touch of lower extremities and strength 5/5 noted. I have low suspicion for cauda equina or other acute spinal cord injury being the cause of his back pain based on his physical examination. He has been ambulatory with normal gait. I believe that symptoms are due to muscle inflammation and strain in the area from the movement and lifting. Instructions given for heat therapy and stretches.We'll discharge with steroids and Flexeril to be taken as needed. Advised to follow-up with PCP for further  evaluation. Patient appears stable for discharge at this time. Strict return precautions given.  Final Clinical Impressions(s) / ED Diagnoses   Final diagnoses:  Acute bilateral low back pain with left-sided sciatica    New Prescriptions New Prescriptions   CYCLOBENZAPRINE (FLEXERIL) 10 MG TABLET    Take 1 tablet (10 mg total) by mouth 2 (two) times daily as needed for muscle spasms.   METHYLPREDNISOLONE (MEDROL DOSEPAK) 4 MG TBPK TABLET    Taper over 6 days.     Dietrich Pates, PA-C 02/18/17 1859    Gwyneth Sprout, MD 02/19/17 2210

## 2017-02-19 ENCOUNTER — Ambulatory Visit: Payer: Self-pay | Admitting: Physician Assistant

## 2017-02-19 DIAGNOSIS — Z0289 Encounter for other administrative examinations: Secondary | ICD-10-CM

## 2017-09-25 ENCOUNTER — Encounter: Payer: Self-pay | Admitting: Emergency Medicine

## 2017-11-10 ENCOUNTER — Other Ambulatory Visit: Payer: Self-pay

## 2017-11-10 ENCOUNTER — Emergency Department (HOSPITAL_BASED_OUTPATIENT_CLINIC_OR_DEPARTMENT_OTHER)
Admission: EM | Admit: 2017-11-10 | Discharge: 2017-11-10 | Disposition: A | Discharge status: Home / Self Care | Payer: Managed Care, Other (non HMO) | Attending: Emergency Medicine | Admitting: Emergency Medicine

## 2017-11-10 ENCOUNTER — Encounter (HOSPITAL_BASED_OUTPATIENT_CLINIC_OR_DEPARTMENT_OTHER): Payer: Self-pay | Admitting: Emergency Medicine

## 2017-11-10 DIAGNOSIS — F17220 Nicotine dependence, chewing tobacco, uncomplicated: Secondary | ICD-10-CM | POA: Diagnosis not present

## 2017-11-10 DIAGNOSIS — S80861A Insect bite (nonvenomous), right lower leg, initial encounter: Secondary | ICD-10-CM

## 2017-11-10 DIAGNOSIS — L03115 Cellulitis of right lower limb: Secondary | ICD-10-CM | POA: Diagnosis not present

## 2017-11-10 DIAGNOSIS — M79661 Pain in right lower leg: Secondary | ICD-10-CM | POA: Diagnosis present

## 2017-11-10 DIAGNOSIS — W57XXXA Bitten or stung by nonvenomous insect and other nonvenomous arthropods, initial encounter: Secondary | ICD-10-CM | POA: Insufficient documentation

## 2017-11-10 MED ORDER — CEPHALEXIN 500 MG PO CAPS: 500.0000 mg | ORAL_CAPSULE | Freq: Four times a day (QID) | ORAL | 0 refills | Status: DC

## 2017-11-10 MED ORDER — DEXAMETHASONE 6 MG PO TABS
10.0000 mg | ORAL_TABLET | Freq: Once | ORAL | Status: AC
  Administered 2017-11-10: 10 mg via ORAL
  Filled 2017-11-10: qty 1

## 2017-11-10 NOTE — ED Notes (Signed)
Pt given d/c instructions as per chart. Rx x 1. Verbalizes understanding. No questions. 

## 2017-11-10 NOTE — ED Provider Notes (Addendum)
[facility] EMERGENCY DEPARTMENT Provider Note   CSN: [id number] Arrival date & time: 11/10/17  1905     History   Chief Complaint Chief Complaint  Patient presents with  . Insect Bite    HPI Arthur Mills is a 27 y.o. male who presents the emergency department today after being stung by a wasp to his right calf yesterday.  Patient reports that this occurred yesterday evening.  He was able to remove the stinger.  He states since that time he has had some developing redness and heat in the surrounding area.  He notes his leg feels more swollen and tight.  He rates his current pain level is a 7/10.  He denies any associated  drainage, fever, nausea/vomiting, difficulty breathing, difficulty swallowing, tongue/lip swelling, chest tightness, shortness of breath, cough, abdominal cramping, nausea/vomiting/diarrhea associated with this.  Patient has been taking benadryl for his symptoms with mild relief.  Nothing makes his symptoms better or worse  .  HPI  Past Medical History:  Diagnosis Date  . Frequent headaches   . History of chicken pox   . History of kidney stones 2012  . Migraine     Patient Active Problem List   Diagnosis Date Noted  . Chronic back pain 07/03/2015  . Tachycardia 07/03/2015  . Chronic migraine without aura without status migrainosus, not intractable 07/03/2015    Past Surgical History:  Procedure Laterality Date  . APPENDECTOMY    . STOMACH SURGERY  "middle school'   repair of "malrotation of stomach"        Home Medications    Prior to Admission medications   Medication Sig Start Date End Date Taking? Authorizing Provider  clindamycin (CLEOCIN) 150 MG capsule Take 2 capsules (300 mg total) by mouth 4 (four) times daily. 02/16/16   Tilden Fossa, MD  cyclobenzaprine (FLEXERIL) 10 MG tablet Take 1 tablet (10 mg total) by mouth 2 (two) times daily as needed for muscle spasms. 02/18/17   Khatri, Hina, PA-C  methylPREDNISolone (MEDROL  DOSEPAK) 4 MG TBPK tablet Taper over 6 days. 02/18/17   Dietrich Pates, PA-C    Family History Family History  Problem Relation Age of Onset  . Diabetes Paternal Grandmother   . Hypertension Paternal Grandmother   . Stroke Paternal Grandmother   . Alcohol abuse Maternal Uncle     Social History Social History   Tobacco Use  . Smoking status: Never Smoker  . Smokeless tobacco: Current User    Types: Chew  Substance Use Topics  . Alcohol use: Yes    Comment: occ  . Drug use: No     Allergies   Isovue [iopamidol]   Review of Systems Review of Systems  All other systems reviewed and are negative.    Physical Exam Updated Vital Signs BP 116/73 (BP Location: Right Arm)   Pulse 93   Temp 98.8 F (37.1 C) (Oral)   Resp 18   Ht 6\' 1"  (1.854 m)   Wt 102.5 kg (226 lb)   SpO2 100%   BMI 29.82 kg/m   Physical Exam  Constitutional: He appears well-developed and well-nourished.  HENT:  Head: Normocephalic and atraumatic.  Right Ear: External ear normal.  Left Ear: External ear normal.  Nose: Nose normal.  Mouth/Throat: Uvula is midline, oropharynx is clear and moist and mucous membranes are normal. No tonsillar exudate.  The patient has normal phonation and is in control of secretions. No stridor.  Midline uvula without edema. Soft palate rises symmetrically.  No angioedema. Tongue protrusion is normal. No trismus. No creptius on neck palpation and patient has good dentition. No gingival erythema or fluctuance noted. Mucus membranes moist.   Eyes: Pupils are equal, round, and reactive to light. Right eye exhibits no discharge. Left eye exhibits no discharge. No scleral icterus.  Neck: Trachea normal. Neck supple. No spinous process tenderness present. No neck rigidity. Normal range of motion present.  Cardiovascular: Normal rate, regular rhythm and intact distal pulses.  No murmur heard. Pulses:      Radial pulses are 2+ on the right side, and 2+ on the left side.        Dorsalis pedis pulses are 2+ on the right side, and 2+ on the left side.       Posterior tibial pulses are 2+ on the right side, and 2+ on the left side.  No lower extremity swelling or edema. Calves symmetric in size bilaterally.  Pulmonary/Chest: Effort normal and breath sounds normal. He exhibits no tenderness.  Abdominal: Soft. Bowel sounds are normal. There is no tenderness. There is no rebound and no guarding.  Musculoskeletal: He exhibits no edema.  Lymphadenopathy:    He has no cervical adenopathy.  Neurological: He is alert.  Skin: Skin is warm and dry. No rash noted. He is not diaphoretic.  Right calf with punctate wound from insect sting.  No obvious stinger in place.  There is noted surrounding erythema as well as generalized heat of the right leg when compared to the left.  There is no area of induration, fluctuance or discharge.  Psychiatric: He has a normal mood and affect.  Nursing note and vitals reviewed.    ED Treatments / Results  Labs (all labs ordered are listed, but only abnormal results are displayed) Labs Reviewed - No data to display  EKG None  Radiology No results found.  Procedures Procedures (including critical care time)  Medications Ordered in ED Medications  dexamethasone (DECADRON) tablet 10 mg (10 mg Oral Given 11/10/17 2138)     Initial Impression / Assessment and Plan / ED Course  I have reviewed the triage vital signs and the nursing notes.  Pertinent labs & imaging results that were available during my care of the patient were reviewed by me and considered in my medical decision making (see chart for details).     27 y.o. male who was poor to be stung by wasp on the right calf yesterday who presents with cellulitic-like changes to the lower extremity.  Patient does not appear to be in anaphylaxis.  There is no angioedema, difficulty swallowing, shortness of breath, abdominal cramping, nausea or vomiting.  He has been self treating with  Benadryl.  Will give patient a course of antibiotics for treatment.  Will give dose of Decadron in the department if partial cause of symptoms are allergic. I do not see stinger of bee in place/remaining. There is no gross abscess that would require I&D. Patient is without fever, N/V or other systemic symptoms. No history of immunocompromise. I advised the patient to follow-up with PCP this week. Specific return precautions discussed. Time was given for all questions to be answered. The patient verbalized understanding and agreement with plan. The patient appears safe for discharge home.  Final Clinical Impressions(s) / ED Diagnoses   Final diagnoses:  Insect bite of right lower leg, initial encounter  Cellulitis of right lower extremity    ED Discharge Orders        Ordered    cephALEXin (  KEFLEX) 500 MG capsule  4 times daily     11/10/17 2116       Princella PellegriniMaczis, Chrisa Hassan M, PA-C 11/11/17 0019    Jacinto HalimMaczis, Wilmarie Sparlin M, PA-C 11/11/17 0019    Jacinto HalimMaczis, Cory Kitt M, PA-C 11/11/17 [id number]    Pfeiffer, Marcy, MD 11/12/17 [phone]

## 2017-11-10 NOTE — ED Triage Notes (Signed)
Patient states that he was stung by a wasp to his right calf yesterday - The patient states that he has swelling to his right calf

## 2017-11-10 NOTE — Discharge Instructions (Signed)
Please take all of your antibiotics until finished!   You may develop abdominal discomfort or diarrhea from the antibiotic.  You may help offset this with probiotics which you can buy or get in yogurt. Do not eat  or take the probiotics until 2 hours after your antibiotic.  ° °Follow up with your PCP this week for recheck. If you develop worsening or new concerning symptoms you can return to the emergency department for re-evaluation. See below for additional return precautions.  ° °Cellulitis is a skin infection. The infected area is usually red and sore. This condition occurs most often in the arms and lower legs. It is very important to get treated for this condition. °Follow these instructions at home: °Take over-the-counter and prescription medicines only as told by your doctor. °If you were prescribed an antibiotic medicine, take it as told by your doctor. Do not stop taking the antibiotic even if you start to feel better. °Drink enough fluid to keep your pee (urine) clear or pale yellow. °Do not touch or rub the infected area. °Raise (elevate) the infected area above the level of your heart while you are sitting or lying down. °Place warm or cold wet cloths (warm or cold compresses) on the infected area. Do this as told by your doctor. °Keep all follow-up visits as told by your doctor. This is important. These visits let your doctor make sure your infection is not getting worse. °Contact a doctor if: °You have a fever. °Your symptoms do not get better after 1-2 days of treatment. °Your bone or joint under the infected area starts to hurt after the skin has healed. °Your infection comes back. This can happen in the same area or another area. °You have a swollen bump in the infected area. °You have new symptoms. °You feel ill and also have muscle aches and pains. °Get help right away if: °Your symptoms get worse. °You feel very sleepy. °You throw up (vomit) or have watery poop (diarrhea) for a long time. °There  are red streaks coming from the infected area. °Your red area gets larger. °Your red area turns darker. ° °

## 2017-11-15 ENCOUNTER — Encounter: Payer: Self-pay | Admitting: Physician Assistant

## 2017-11-15 ENCOUNTER — Other Ambulatory Visit: Payer: Self-pay

## 2017-11-15 ENCOUNTER — Ambulatory Visit (INDEPENDENT_AMBULATORY_CARE_PROVIDER_SITE_OTHER): Payer: Managed Care, Other (non HMO) | Admitting: Physician Assistant

## 2017-11-15 VITALS — BP 118/80 | HR 80 | Temp 98.1°F | Resp 16 | Ht 71.5 in | Wt 224.0 lb

## 2017-11-15 DIAGNOSIS — F419 Anxiety disorder, unspecified: Secondary | ICD-10-CM | POA: Diagnosis not present

## 2017-11-15 DIAGNOSIS — F329 Major depressive disorder, single episode, unspecified: Secondary | ICD-10-CM | POA: Diagnosis not present

## 2017-11-15 DIAGNOSIS — Z91030 Bee allergy status: Secondary | ICD-10-CM | POA: Diagnosis not present

## 2017-11-15 DIAGNOSIS — L03115 Cellulitis of right lower limb: Secondary | ICD-10-CM

## 2017-11-15 MED ORDER — EPINEPHRINE 0.3 MG/0.3ML IJ SOAJ: 0.3000 mg | Freq: Once | INTRAMUSCULAR | 1 refills | Status: AC

## 2017-11-15 MED ORDER — ESCITALOPRAM OXALATE 10 MG PO TABS: 10.0000 mg | ORAL_TABLET | Freq: Every day | ORAL | 3 refills | Status: DC

## 2017-11-15 NOTE — Patient Instructions (Signed)
The cellulitis is resolving nicely.  The mild hardness of the skin in the area will continue to resolve. If you note any redness, tenderness recurring, please come see me.  Make sure to finish the entire course of the antibiotic.  I sent in a refill of the epipen to have on hand.  Start the Lexapro as directed. Follow-up with me in 4-6 weeks. We can do your physical at that time.

## 2017-11-15 NOTE — Progress Notes (Signed)
Patient presents to clinic today for ER follow-up regarding RLE cellulitis. Patient also with acute concerns today.  Patient presented to ER on 11/10/17 after noted redness and swelling of RLE s/p insect sting. Patient was given Decadron injection to help with swelling. Was discharged home to complete 5  days of Keflex 500 mg QID. Since discharge, patient endorses taking medications as directed. Is tolerating well without side effect. Has noted resolution of swelling, redness and warmth. Very mild tenderness noted with palpation overall.  Patient endorses several months of being more irritable along with anxiety and anhedonia. Notes occasional depressed mood along with this. 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 Would like to discuss options to help with this. Denies SI/HI.  Past Medical History:  Diagnosis Date  . Frequent headaches   . History of chicken pox   . History of kidney stones 2012  . Migraine     Current Outpatient Medications on File Prior to Visit  Medication Sig Dispense Refill  . cephALEXin (KEFLEX) 500 MG capsule Take 1 capsule (500 mg total) by mouth 4 (four) times daily. 20 capsule 0   No current facility-administered medications on file prior to visit.     Allergies  Allergen Reactions  . Isovue [Iopamidol]     02/16/2016 pt had vomiting and diffuse redness with hives/itching immediately after receiving 47cc iv isovue 300.    Family History  Problem Relation Age of Onset  . Diabetes Paternal Grandmother   . Hypertension Paternal Grandmother   . Stroke Paternal Grandmother   . Alcohol abuse Maternal Uncle     Social History   Socioeconomic History  . Marital status: Single    Spouse name: Not on file  . Number of children: 0  . Years of education: Not on file  . Highest education level: Not on file  Occupational History  . Occupation: Training and development officerConstruction  Social Needs  . Financial resource strain: Not on file  . Food insecurity:      Worry: Not on file    Inability: Not on file  . Transportation needs:    Medical: Not on file    Non-medical: Not on file  Tobacco Use  . Smoking status: Never Smoker  . Smokeless tobacco: Current User    Types: Chew  Substance and Sexual Activity  . Alcohol use: Yes    Comment: occ  . Drug use: No  . Sexual activity: Yes    Partners: Female  Lifestyle  . Physical activity:    Days per week: Not on file    Minutes per session: Not on file  . Stress: Not on file  Relationships  . Social connections:    Talks on phone: Not on file    Gets together: Not on file    Attends religious service: Not on file    Active member of club or organization: Not on file    Attends meetings of clubs or organizations: Not on file    Relationship status: Not on file  Other Topics Concern  . Not on file  Social History Narrative  . Not on file   Review of Systems - See HPI.  All other ROS are negative.  BP 118/80   Pulse 80   Temp 98.1 F (36.7 C) (Oral)   Resp 16   Ht 5' 11.5" (1.816 m)   Wt 224 lb (101.6 kg)   SpO2 98%   BMI 30.81 kg/m   Physical Exam  Constitutional: He appears well-developed  and well-nourished.  HENT:  Head: Normocephalic and atraumatic.  Eyes: Conjunctivae are normal.  Neck: Neck supple.  Cardiovascular: Normal rate, regular rhythm and normal heart sounds.  Pulmonary/Chest: Effort normal.  Skin:     Psychiatric: He has a normal mood and affect.  Vitals reviewed.  Assessment/Plan: 1. Anxiety and depression Rx Lexapro 10 mg daily. Counseling recommended but patient declines due to time constraints currently. Follow-up 1 month. Will complete CPE at that time.  - escitalopram (LEXAPRO) 10 MG tablet; Take 1 tablet (10 mg total) by mouth daily.  Dispense: 30 tablet; Refill: 3  2. Cellulitis of right leg Resolving. Finish entire course of antibiotic. Strict return precautions reviewed.   3. Bee sting allergy Epipen refilled. Proper indications for use  reviewed with patient.  - EPINEPHrine 0.3 mg/0.3 mL IJ SOAJ injection; Inject 0.3 mLs (0.3 mg total) into the muscle once for 1 dose.  Dispense: 1 Device; Refill: 1   Piedad Climes, [place]

## 2017-12-27 ENCOUNTER — Ambulatory Visit: Payer: Managed Care, Other (non HMO) | Admitting: Physician Assistant

## 2017-12-30 ENCOUNTER — Ambulatory Visit: Payer: Managed Care, Other (non HMO) | Admitting: Physician Assistant

## 2017-12-30 DIAGNOSIS — Z0289 Encounter for other administrative examinations: Secondary | ICD-10-CM

## 2018-10-31 ENCOUNTER — Encounter: Payer: Managed Care, Other (non HMO) | Admitting: Physician Assistant

## 2018-11-24 ENCOUNTER — Other Ambulatory Visit: Payer: Self-pay

## 2018-11-24 ENCOUNTER — Encounter (HOSPITAL_BASED_OUTPATIENT_CLINIC_OR_DEPARTMENT_OTHER): Payer: Self-pay

## 2018-11-24 ENCOUNTER — Emergency Department (HOSPITAL_BASED_OUTPATIENT_CLINIC_OR_DEPARTMENT_OTHER)
Admission: EM | Admit: 2018-11-24 | Discharge: 2018-11-24 | Disposition: A | Discharge status: Home / Self Care | Payer: Self-pay | Attending: Emergency Medicine | Admitting: Emergency Medicine

## 2018-11-24 DIAGNOSIS — L02415 Cutaneous abscess of right lower limb: Secondary | ICD-10-CM | POA: Insufficient documentation

## 2018-11-24 DIAGNOSIS — F17220 Nicotine dependence, chewing tobacco, uncomplicated: Secondary | ICD-10-CM | POA: Insufficient documentation

## 2018-11-24 DIAGNOSIS — L0291 Cutaneous abscess, unspecified: Secondary | ICD-10-CM

## 2018-11-24 MED ORDER — HYDROCODONE-ACETAMINOPHEN 5-325 MG PO TABS: 1.0000 | ORAL_TABLET | Freq: Four times a day (QID) | ORAL | 0 refills | Status: DC | PRN

## 2018-11-24 MED ORDER — SULFAMETHOXAZOLE-TRIMETHOPRIM 800-160 MG PO TABS: 1.0000 | ORAL_TABLET | Freq: Two times a day (BID) | ORAL | 0 refills | Status: AC

## 2018-11-24 MED ORDER — LIDOCAINE-EPINEPHRINE (PF) 2 %-1:200000 IJ SOLN
10.0000 mL | Freq: Once | INTRAMUSCULAR | Status: AC
  Administered 2018-11-24: 10 mL
  Filled 2018-11-24 (×2): qty 10

## 2018-11-24 NOTE — Discharge Instructions (Addendum)
Please take Ibuprofen (Advil, motrin) and Tylenol (acetaminophen) to relieve your pain.  You may take up to 600 MG (3 pills) of normal strength ibuprofen every 8 hours as needed.  In between doses of ibuprofen you make take tylenol, up to 1,000 mg (two extra strength pills).  Do not take more than 3,000 mg tylenol in a 24 hour period.  Please check all medication labels as many medications such as pain and cold medications may contain tylenol.  Do not drink alcohol while taking these medications.  Do not take other NSAID'S while taking ibuprofen (such as aleve or naproxen).  Please take ibuprofen with food to decrease stomach upset.  You may have diarrhea from the antibiotics.  It is very important that you continue to take the antibiotics even if you get diarrhea unless a medical professional tells you that you may stop taking them.  If you stop too early the bacteria you are being treated for will become stronger and you may need different, more powerful antibiotics that have more side effects and worsening diarrhea.  Please stay well hydrated and consider probiotics as they may decrease the severity of your diarrhea.   You are being prescribed a medication which may make you sleepy. For 24 hours after one dose please do not drive, operate heavy machinery, care for a small child with out another adult present, or perform any activities that may cause harm to you or someone else if you were to fall asleep or be impaired.

## 2018-11-24 NOTE — ED Notes (Signed)
Patient verbalizes understanding of discharge instructions. Opportunity for questioning and answers were provided. Armband removed by staff, pt discharged from ED.  

## 2018-11-24 NOTE — ED Provider Notes (Signed)
[facility] EMERGENCY DEPARTMENT Provider Note   CSN: [id number] Arrival date & time: 11/24/18  1345    History   Chief Complaint Chief Complaint  Patient presents with  . Abscess    HPI Arthur Mills is a 28 y.o. male with no significant pertinent past medical history who presents today for evaluation of a wound.  He reports that on Friday he was riding his motorcycle and when he got off he noticed a sore red area on the lateral right hip.  He reports that he attempted to squeeze the area without any drainage.  Since then it has continued to swell and become more painful.  He denies any fevers.  He has not attempted OTC pain meds.  No recent antibiotics.  He does not have a history of similar in the past.     HPI  Past Medical History:  Diagnosis Date  . Frequent headaches   . History of chicken pox   . History of kidney stones 2012  . Migraine     Patient Active Problem List   Diagnosis Date Noted  . Chronic back pain 07/03/2015  . Tachycardia 07/03/2015  . Chronic migraine without aura without status migrainosus, not intractable 07/03/2015    Past Surgical History:  Procedure Laterality Date  . APPENDECTOMY    . STOMACH SURGERY  "middle school'   repair of "malrotation of stomach"        Home Medications    Prior to Admission medications   Medication Sig Start Date End Date Taking? Authorizing Provider  cephALEXin (KEFLEX) 500 MG capsule Take 1 capsule (500 mg total) by mouth 4 (four) times daily. 11/10/17   Maczis, Barth Kirks, PA-C  escitalopram (LEXAPRO) 10 MG tablet Take 1 tablet (10 mg total) by mouth daily. 11/15/17   Brunetta Jeans, PA-C  HYDROcodone-acetaminophen (NORCO/VICODIN) 5-325 MG tablet Take 1 tablet by mouth every 6 (six) hours as needed. 11/24/18   Lorin Glass, PA-C  sulfamethoxazole-trimethoprim (BACTRIM DS) 800-160 MG tablet Take 1 tablet by mouth 2 (two) times daily for 7 days. 11/24/18 12/01/18  Lorin Glass, PA-C     Family History Family History  Problem Relation Age of Onset  . Diabetes Paternal Grandmother   . Hypertension Paternal Grandmother   . Stroke Paternal Grandmother   . Alcohol abuse Maternal Uncle     Social History Social History   Tobacco Use  . Smoking status: Never Smoker  . Smokeless tobacco: Current User    Types: Chew  Substance Use Topics  . Alcohol use: Yes    Comment: occ  . Drug use: No     Allergies   Isovue [iopamidol]   Review of Systems Review of Systems  Constitutional: Negative for chills and fever.  Skin: Positive for color change and wound.  Neurological: Negative for weakness.  All other systems reviewed and are negative.    Physical Exam Updated Vital Signs BP (!) 115/100 (BP Location: Right Arm)   Pulse 89   Temp 98.2 F (36.8 C) (Oral)   Resp 18   Ht 6\' 1"  (1.854 m)   SpO2 97%   BMI 29.55 kg/m   Physical Exam Vitals signs and nursing note reviewed.  Constitutional:      General: He is not in acute distress. Neck:     Musculoskeletal: Normal range of motion.  Cardiovascular:     Rate and Rhythm: Normal rate.  Pulmonary:     Effort: Pulmonary effort is normal. No respiratory  distress.  Musculoskeletal:     Right lower leg: No edema.     Left lower leg: No edema.  Skin:    General: Skin is warm and dry.     Comments: Please see clinical image.  There is a 4 cm x 4 cm area of redness on the right anterior lateral thigh with mild edema.  There is a small area of localized fluctuance in the middle under a darkly pigmented area.   Neurological:     General: No focal deficit present.     Mental Status: He is alert.  Psychiatric:        Mood and Affect: Mood normal.        Behavior: Behavior normal.        ED Treatments / Results  Labs (all labs ordered are listed, but only abnormal results are displayed) Labs Reviewed - No data to display  EKG None  Radiology No results found.  Procedures Ultrasound ED Soft  Tissue  Date/Time: 11/24/2018 3:27 PM Performed by: Cristina GongHammond, Cinque Begley W, PA-C Authorized by: Cristina GongHammond, Jalia Zuniga W, PA-C   Procedure details:    Indications: localization of abscess and evaluate for cellulitis     Transverse view:  Visualized   Longitudinal view:  Visualized   Images: archived   Location:    Location: lower extremity     Side:  Right Findings:     abscess present    cellulitis present  .Marland Kitchen.Incision and Drainage  Date/Time: 11/24/2018 10:37 PM Performed by: Cristina GongHammond, Zamari Bonsall W, PA-C Authorized by: Cristina GongHammond, Altagracia Rone W, PA-C   Consent:    Consent obtained:  Verbal   Consent given by:  Patient   Risks discussed:  Bleeding, incomplete drainage, pain and infection (Damage to other structures, need for additional procedures)   Alternatives discussed:  No treatment, alternative treatment and referral Location:    Type:  Abscess   Size:  1cmx1cm   Location:  Lower extremity   Lower extremity location:  Leg   Leg location:  R upper leg Pre-procedure details:    Skin preparation:  Chloraprep Anesthesia (see MAR for exact dosages):    Anesthesia method:  Local infiltration   Local anesthetic:  Lidocaine 2% WITH epi Procedure type:    Complexity:  Complex Procedure details:    Incision types:  Stab incision   Incision depth:  Subcutaneous   Scalpel blade:  11   Wound management:  Probed and deloculated and irrigated with saline   Drainage:  Purulent and bloody   Drainage amount:  Scant   Wound treatment:  Wound left open   Packing materials:  None Post-procedure details:    Patient tolerance of procedure:  Tolerated well, no immediate complications   (including critical care time)  Medications Ordered in ED Medications  lidocaine-EPINEPHrine (XYLOCAINE W/EPI) 2 %-1:200000 (PF) injection 10 mL (10 mLs Infiltration Given 11/24/18 1530)     Initial Impression / Assessment and Plan / ED Course  I have reviewed the triage vital signs and the nursing notes.   Pertinent labs & imaging results that were available during my care of the patient were reviewed by me and considered in my medical decision making (see chart for details).       Patient with skin abscess amenable to incision and drainage.  Abscess was not large enough to warrant packing or drain,  wound recheck in 2 days. Encouraged home warm soaks and flushing.  Mild signs of cellulitis is surrounding skin.  Will d/c to home.  Given prescription for Bactrim due to presence of cellulitis to surrounding skin, and the need to cover for MRSA infection based on the purulent collection.  According to patient Tdap is up-to-date.  [place] after which he was given a short course of p.o. narcotic pain medicine.  Return precautions were discussed with patient who states their understanding.  At the time of discharge patient denied any unaddressed complaints or concerns.  Patient is agreeable for discharge home.     Final Clinical Impressions(s) / ED Diagnoses   Final diagnoses:  Abscess    ED Discharge Orders         Ordered    HYDROcodone-acetaminophen (NORCO/VICODIN) 5-325 MG tablet  Every 6 hours PRN     11/24/18 1614    sulfamethoxazole-trimethoprim (BACTRIM DS) 800-160 MG tablet  2 times daily     11/24/18 1614           Cristina GongHammond, Jalessa Peyser W, [place]-C 11/24/18 2239    Rolan BuccoBelfi, Melanie, MD 11/24/18 2244

## 2018-11-24 NOTE — ED Triage Notes (Signed)
C/o right groin ?abscess x 3 days-NAD-steady gait

## 2018-12-23 ENCOUNTER — Emergency Department (HOSPITAL_BASED_OUTPATIENT_CLINIC_OR_DEPARTMENT_OTHER)
Admission: EM | Admit: 2018-12-23 | Discharge: 2018-12-23 | Disposition: A | Discharge status: Home / Self Care | Payer: Self-pay | Attending: Emergency Medicine | Admitting: Emergency Medicine

## 2018-12-23 ENCOUNTER — Other Ambulatory Visit: Payer: Self-pay

## 2018-12-23 ENCOUNTER — Encounter (HOSPITAL_BASED_OUTPATIENT_CLINIC_OR_DEPARTMENT_OTHER): Payer: Self-pay | Admitting: *Deleted

## 2018-12-23 DIAGNOSIS — Z888 Allergy status to other drugs, medicaments and biological substances status: Secondary | ICD-10-CM | POA: Insufficient documentation

## 2018-12-23 DIAGNOSIS — N492 Inflammatory disorders of scrotum: Secondary | ICD-10-CM | POA: Insufficient documentation

## 2018-12-23 DIAGNOSIS — Z79899 Other long term (current) drug therapy: Secondary | ICD-10-CM | POA: Insufficient documentation

## 2018-12-23 LAB — CBG MONITORING, ED: Glucose-Capillary: 98 mg/dL (ref 70–99)

## 2018-12-23 MED ORDER — CEPHALEXIN 500 MG PO CAPS: 500.0000 mg | ORAL_CAPSULE | Freq: Four times a day (QID) | ORAL | 0 refills | Status: DC

## 2018-12-23 MED ORDER — OXYCODONE-ACETAMINOPHEN 5-325 MG PO TABS
2.0000 | ORAL_TABLET | Freq: Once | ORAL | Status: AC
  Administered 2018-12-23: 2 via ORAL
  Filled 2018-12-23: qty 2

## 2018-12-23 MED ORDER — OXYCODONE-ACETAMINOPHEN 5-325 MG PO TABS: 1.0000 | ORAL_TABLET | Freq: Four times a day (QID) | ORAL | 0 refills | Status: DC | PRN

## 2018-12-23 MED ORDER — LIDOCAINE-EPINEPHRINE (PF) 2 %-1:200000 IJ SOLN
20.0000 mL | Freq: Once | INTRAMUSCULAR | Status: AC
  Administered 2018-12-23: 20 mL
  Filled 2018-12-23: qty 20

## 2018-12-23 MED ORDER — LIDOCAINE-EPINEPHRINE (PF) 2 %-1:200000 IJ SOLN
INTRAMUSCULAR | Status: AC
  Filled 2018-12-23: qty 10

## 2018-12-23 NOTE — ED Provider Notes (Signed)
[facility] EMERGENCY DEPARTMENT Provider Note   CSN: [id number] Arrival date & time: 12/23/18  2111     History   Chief Complaint Chief Complaint  Patient presents with  . Abscess    HPI Sherren MochaJustin Cipriani is a 28 y.o. male.  He has had a history recently of about 4 superficial ulcers.  He was most recently treated about a month ago for sore on his hip.  He is complaining of 2 or 3 days of some scrotal pain and swelling now is been draining a little bit of material.  No trauma no urinary symptoms no fevers or chills.  His girlfriend said he has been kind of sweaty at night so wonders he may be running a low-grade temp.  No prior history of STDs.     The history is provided by the patient.  Abscess Location:  Pelvis Pelvic abscess location:  Scrotum Size:  4 Abscess quality: draining, fluctuance, induration, painful and redness   Red streaking: no   Progression:  Worsening Pain details:    Quality:  Dull   Severity:  Moderate   Timing:  Constant   Progression:  Worsening Chronicity:  New Context: not diabetes, not immunosuppression and not injected drug use   Relieved by:  Nothing Worsened by:  Draining/squeezing Ineffective treatments:  None tried Associated symptoms: no fever and no headaches   Risk factors: prior abscess   Risk factors: no hx of MRSA     Past Medical History:  Diagnosis Date  . Frequent headaches   . History of chicken pox   . History of kidney stones 2012  . Migraine     Patient Active Problem List   Diagnosis Date Noted  . Chronic back pain 07/03/2015  . Tachycardia 07/03/2015  . Chronic migraine without aura without status migrainosus, not intractable 07/03/2015    Past Surgical History:  Procedure Laterality Date  . APPENDECTOMY    . STOMACH SURGERY  "middle school'   repair of "malrotation of stomach"        Home Medications    Prior to Admission medications   Medication Sig Start Date End Date Taking? Authorizing  Provider  cephALEXin (KEFLEX) 500 MG capsule Take 1 capsule (500 mg total) by mouth 4 (four) times daily. 11/10/17   Maczis, Elmer SowMichael M, PA-C  escitalopram (LEXAPRO) 10 MG tablet Take 1 tablet (10 mg total) by mouth daily. 11/15/17   Waldon MerlMartin, William C, PA-C  HYDROcodone-acetaminophen (NORCO/VICODIN) 5-325 MG tablet Take 1 tablet by mouth every 6 (six) hours as needed. 11/24/18   Cristina GongHammond, Elizabeth W, PA-C    Family History Family History  Problem Relation Age of Onset  . Diabetes Paternal Grandmother   . Hypertension Paternal Grandmother   . Stroke Paternal Grandmother   . Alcohol abuse Maternal Uncle     Social History Social History   Tobacco Use  . Smoking status: Never Smoker  . Smokeless tobacco: Current User    Types: Chew  Substance Use Topics  . Alcohol use: Yes    Comment: occ  . Drug use: No     Allergies   Isovue [iopamidol]   Review of Systems Review of Systems  Constitutional: Negative for fever.  HENT: Negative for sore throat.   Respiratory: Negative for shortness of breath.   Cardiovascular: Negative for chest pain.  Gastrointestinal: Negative for abdominal pain.  Genitourinary: Positive for scrotal swelling. Negative for dysuria.  Musculoskeletal: Negative for back pain.  Skin: Positive for wound. Negative for  rash.  Neurological: Negative for headaches.     Physical Exam Updated Vital Signs BP (!) 141/87   Temp 98.2 F (36.8 C)   Resp 18   Ht 6\' 1"  (1.854 m)   Wt 91.6 kg   SpO2 99%   BMI 26.65 kg/m   Physical Exam Vitals signs and nursing note reviewed.  Constitutional:      Appearance: He is well-developed.  HENT:     Head: Normocephalic and atraumatic.  Eyes:     Conjunctiva/sclera: Conjunctivae normal.  Neck:     Musculoskeletal: Neck supple.  Pulmonary:     Effort: Pulmonary effort is normal.  Abdominal:     General: Abdomen is flat.     Tenderness: There is no guarding or rebound.  Genitourinary:    Penis: Normal.       Scrotum/Testes:        Left: Mass and swelling present.       Comments: He has an area on his upper lateral scrotum of tender induration about 4 cm with some areas that looks like he has had some drainage out of.  This does not seem to be connected with his testicles at all. Skin:    General: Skin is warm and dry.     Capillary Refill: Capillary refill takes less than 2 seconds.  Neurological:     General: No focal deficit present.     Mental Status: He is alert.     GCS: GCS eye subscore is 4. GCS verbal subscore is 5. GCS motor subscore is 6.      ED Treatments / Results  Labs (all labs ordered are listed, but only abnormal results are displayed) Labs Reviewed - No data to display  EKG None  Radiology No results found.  Procedures .Marland Kitchen.Incision and Drainage  Date/Time: 12/23/2018 10:24 PM Performed by: Terrilee FilesButler,  C, MD Authorized by: Terrilee FilesButler,  C, MD   Consent:    Consent obtained:  Verbal   Consent given by:  Patient   Risks discussed:  Bleeding, incomplete drainage, pain and infection   Alternatives discussed:  No treatment, delayed treatment and referral Location:    Type:  Abscess   Size:  4   Location:  Anogenital   Anogenital location:  Scrotal wall Pre-procedure details:    Skin preparation:  Betadine Anesthesia (see MAR for exact dosages):    Anesthesia method:  Local infiltration   Local anesthetic:  Lidocaine 2% WITH epi Procedure type:    Complexity:  Simple Procedure details:    Incision types:  Single straight   Scalpel blade:  15   Wound management:  Probed and deloculated   Drainage:  Bloody   Drainage amount:  Moderate   Packing materials:  1/2 in iodoform gauze   Amount 1/2" iodoform:  6 Post-procedure details:    Patient tolerance of procedure:  Tolerated well, no immediate complications   (including critical care time)  Medications Ordered in ED Medications  oxyCODONE-acetaminophen (PERCOCET/ROXICET) 5-325 MG per tablet 2  tablet (2 tablets Oral Given 12/23/18 2153)  lidocaine-EPINEPHrine (XYLOCAINE W/EPI) 2 %-1:200000 (PF) injection 20 mL (20 mLs Infiltration Given 12/23/18 2154)     Initial Impression / Assessment and Plan / ED Course  I have reviewed the triage vital signs and the nursing notes.  Pertinent labs & imaging results that were available during my care of the patient were reviewed by me and considered in my medical decision making (see chart for details).  Final Clinical Impressions(s) / ED Diagnoses   Final diagnoses:  Scrotal wall abscess    ED Discharge Orders         Ordered    cephALEXin (KEFLEX) 500 MG capsule  4 times daily     12/23/18 2240    oxyCODONE-acetaminophen (PERCOCET/ROXICET) 5-325 MG tablet  Every 6 hours PRN     12/23/18 2240           Hayden Rasmussen, MD 12/24/18 [phone]

## 2018-12-23 NOTE — ED Notes (Signed)
ED Provider at bedside. 

## 2018-12-23 NOTE — Discharge Instructions (Signed)
You were seen in the emergency department for an abscess on your scrotum.  You had an incision and drainage of that area with a packing placed.  This packing will need to be removed in 2 to 3 days.  Please finish your antibiotics and use warm compresses to that area.  You may get it wet by soaking in a warm tub or in the shower.  If you experience any worsening symptoms please return to the emergency department or follow-up with urology.  A wound culture was taken to see if this may be MRSA.

## 2018-12-23 NOTE — ED Triage Notes (Signed)
C/o abscess to left scrotum x 4 days

## 2018-12-26 ENCOUNTER — Encounter (HOSPITAL_BASED_OUTPATIENT_CLINIC_OR_DEPARTMENT_OTHER): Payer: Self-pay | Admitting: Emergency Medicine

## 2018-12-26 ENCOUNTER — Other Ambulatory Visit: Payer: Self-pay

## 2018-12-26 DIAGNOSIS — N492 Inflammatory disorders of scrotum: Principal | ICD-10-CM | POA: Insufficient documentation

## 2018-12-26 DIAGNOSIS — Z20828 Contact with and (suspected) exposure to other viral communicable diseases: Secondary | ICD-10-CM | POA: Insufficient documentation

## 2018-12-26 DIAGNOSIS — Z888 Allergy status to other drugs, medicaments and biological substances status: Secondary | ICD-10-CM | POA: Insufficient documentation

## 2018-12-26 DIAGNOSIS — Z23 Encounter for immunization: Secondary | ICD-10-CM | POA: Insufficient documentation

## 2018-12-26 DIAGNOSIS — B9689 Other specified bacterial agents as the cause of diseases classified elsewhere: Secondary | ICD-10-CM | POA: Insufficient documentation

## 2018-12-26 DIAGNOSIS — F329 Major depressive disorder, single episode, unspecified: Secondary | ICD-10-CM | POA: Insufficient documentation

## 2018-12-26 DIAGNOSIS — I889 Nonspecific lymphadenitis, unspecified: Secondary | ICD-10-CM | POA: Insufficient documentation

## 2018-12-26 DIAGNOSIS — F419 Anxiety disorder, unspecified: Secondary | ICD-10-CM | POA: Insufficient documentation

## 2018-12-26 DIAGNOSIS — F17290 Nicotine dependence, other tobacco product, uncomplicated: Secondary | ICD-10-CM | POA: Insufficient documentation

## 2018-12-26 DIAGNOSIS — Z79899 Other long term (current) drug therapy: Secondary | ICD-10-CM | POA: Insufficient documentation

## 2018-12-26 DIAGNOSIS — Z791 Long term (current) use of non-steroidal anti-inflammatories (NSAID): Secondary | ICD-10-CM | POA: Insufficient documentation

## 2018-12-26 LAB — AEROBIC CULTURE W GRAM STAIN (SUPERFICIAL SPECIMEN): Special Requests: NORMAL

## 2018-12-26 NOTE — ED Triage Notes (Signed)
Patient presents with complaints of abscess to scrotum; states seen here 4 days ago for same and states partial packing still in wound. States pain is not improving.

## 2018-12-27 ENCOUNTER — Emergency Department (HOSPITAL_BASED_OUTPATIENT_CLINIC_OR_DEPARTMENT_OTHER): Payer: Self-pay

## 2018-12-27 ENCOUNTER — Telehealth: Payer: Self-pay | Admitting: Emergency Medicine

## 2018-12-27 ENCOUNTER — Observation Stay (HOSPITAL_BASED_OUTPATIENT_CLINIC_OR_DEPARTMENT_OTHER)
Admission: EM | Admit: 2018-12-27 | Discharge: 2018-12-28 | Disposition: A | Discharge status: Home / Self Care | Payer: Self-pay | Attending: Family Medicine | Admitting: Family Medicine

## 2018-12-27 ENCOUNTER — Encounter (HOSPITAL_BASED_OUTPATIENT_CLINIC_OR_DEPARTMENT_OTHER): Payer: Self-pay | Admitting: Emergency Medicine

## 2018-12-27 DIAGNOSIS — I889 Nonspecific lymphadenitis, unspecified: Secondary | ICD-10-CM

## 2018-12-27 DIAGNOSIS — B9562 Methicillin resistant Staphylococcus aureus infection as the cause of diseases classified elsewhere: Secondary | ICD-10-CM | POA: Diagnosis present

## 2018-12-27 DIAGNOSIS — N492 Inflammatory disorders of scrotum: Secondary | ICD-10-CM | POA: Diagnosis present

## 2018-12-27 DIAGNOSIS — L0390 Cellulitis, unspecified: Secondary | ICD-10-CM | POA: Diagnosis present

## 2018-12-27 LAB — BASIC METABOLIC PANEL
Anion gap: 11 (ref 5–15)
BUN: 12 mg/dL (ref 6–20)
CO2: 28 mmol/L (ref 22–32)
Calcium: 9.6 mg/dL (ref 8.9–10.3)
Chloride: 100 mmol/L (ref 98–111)
Creatinine, Ser: 1.24 mg/dL (ref 0.61–1.24)
GFR calc Af Amer: >60 mL/min (ref >60)
GFR calc non Af Amer: >60 mL/min (ref >60)
Glucose, Bld: 83 mg/dL (ref 70–99)
Potassium: 3.8 mmol/L (ref 3.5–5.1)
Sodium: 139 mmol/L (ref 135–145)

## 2018-12-27 LAB — CBC WITH DIFFERENTIAL/PLATELET
Abs Immature Granulocytes: 0.06 10*3/uL (ref 0.00–0.07)
Basophils Absolute: 0.1 10*3/uL (ref 0.0–0.1)
Basophils Relative: 1 %
Eosinophils Absolute: 0.2 10*3/uL (ref 0.0–0.5)
Eosinophils Relative: 3 %
HCT: 51 % (ref 39.0–52.0)
Hemoglobin: 16.2 g/dL (ref 13.0–17.0)
Immature Granulocytes: 1 %
Lymphocytes Relative: 32 %
Lymphs Abs: 2.6 10*3/uL (ref 0.7–4.0)
MCH: 28.3 pg (ref 26.0–34.0)
MCHC: 31.8 g/dL (ref 30.0–36.0)
MCV: 89 fL (ref 80.0–100.0)
Monocytes Absolute: 0.7 10*3/uL (ref 0.1–1.0)
Monocytes Relative: 9 %
Neutro Abs: 4.6 10*3/uL (ref 1.7–7.7)
Neutrophils Relative %: 54 %
Platelets: 290 10*3/uL (ref 150–400)
RBC: 5.73 MIL/uL (ref 4.22–5.81)
RDW: 12 % (ref 11.5–15.5)
WBC: 8.3 10*3/uL (ref 4.0–10.5)
nRBC: 0 % (ref 0.0–0.2)

## 2018-12-27 LAB — MRSA PCR SCREENING: MRSA by PCR: POSITIVE — AB

## 2018-12-27 LAB — SURGICAL PCR SCREEN
MRSA, PCR: POSITIVE — AB
Staphylococcus aureus: POSITIVE — AB

## 2018-12-27 LAB — SARS CORONAVIRUS 2 BY RT PCR (HOSPITAL ORDER, PERFORMED IN ~~LOC~~ HOSPITAL LAB): SARS Coronavirus 2: NEGATIVE

## 2018-12-27 MED ORDER — PIPERACILLIN-TAZOBACTAM 3.375 G IVPB
3.3750 g | Freq: Three times a day (TID) | INTRAVENOUS | Status: DC
  Administered 2018-12-27 – 2018-12-28 (×3): 3.375 g via INTRAVENOUS
  Filled 2018-12-27 (×3): qty 50

## 2018-12-27 MED ORDER — PIPERACILLIN-TAZOBACTAM 3.375 G IVPB 30 MIN
3.3750 g | Freq: Once | INTRAVENOUS | Status: AC
  Administered 2018-12-27: 3.375 g via INTRAVENOUS
  Filled 2018-12-27 (×2): qty 50

## 2018-12-27 MED ORDER — HYDROCODONE-ACETAMINOPHEN 5-325 MG PO TABS: 1.0000 | ORAL_TABLET | Freq: Four times a day (QID) | ORAL | Status: DC | PRN

## 2018-12-27 MED ORDER — TRAMADOL HCL 50 MG PO TABS: 100.0000 mg | ORAL_TABLET | Freq: Two times a day (BID) | ORAL | Status: DC | PRN

## 2018-12-27 MED ORDER — MORPHINE SULFATE (PF) 2 MG/ML IV SOLN: 1.0000 mg | INTRAVENOUS | Status: DC | PRN

## 2018-12-27 MED ORDER — SODIUM CHLORIDE 0.9 % IV SOLN
INTRAVENOUS | Status: DC
  Administered 2018-12-27: 10:00:00 via INTRAVENOUS
  Administered 2018-12-27: 450 mL via INTRAVENOUS
  Administered 2018-12-28: 03:00:00 via INTRAVENOUS

## 2018-12-27 MED ORDER — VANCOMYCIN HCL IN DEXTROSE 1-5 GM/200ML-% IV SOLN
1000.0000 mg | Freq: Once | INTRAVENOUS | Status: AC
  Administered 2018-12-27: 1000 mg via INTRAVENOUS
  Filled 2018-12-27: qty 200

## 2018-12-27 MED ORDER — SODIUM CHLORIDE 0.9 % IV SOLN
INTRAVENOUS | Status: DC | PRN
  Administered 2018-12-27: 500 mL via INTRAVENOUS

## 2018-12-27 MED ORDER — KETOROLAC TROMETHAMINE 30 MG/ML IJ SOLN
15.0000 mg | Freq: Once | INTRAMUSCULAR | Status: AC
  Administered 2018-12-27: 15 mg via INTRAVENOUS
  Filled 2018-12-27: qty 1

## 2018-12-27 MED ORDER — MUPIROCIN 2 % EX OINT
1.0000 "application " | TOPICAL_OINTMENT | Freq: Two times a day (BID) | CUTANEOUS | Status: DC
  Administered 2018-12-27 – 2018-12-28 (×3): 1 via NASAL
  Filled 2018-12-27: qty 22

## 2018-12-27 MED ORDER — SENNOSIDES-DOCUSATE SODIUM 8.6-50 MG PO TABS: 2.0000 | ORAL_TABLET | Freq: Every evening | ORAL | Status: DC | PRN

## 2018-12-27 MED ORDER — TETANUS-DIPHTH-ACELL PERTUSSIS 5-2.5-18.5 LF-MCG/0.5 IM SUSP
0.5000 mL | Freq: Once | INTRAMUSCULAR | Status: AC
  Administered 2018-12-27: 04:00:00 0.5 mL via INTRAMUSCULAR
  Filled 2018-12-27: qty 0.5

## 2018-12-27 MED ORDER — VANCOMYCIN HCL 10 G IV SOLR
1250.0000 mg | Freq: Two times a day (BID) | INTRAVENOUS | Status: DC
  Administered 2018-12-27 – 2018-12-28 (×3): 1250 mg via INTRAVENOUS
  Filled 2018-12-27 (×4): qty 1250

## 2018-12-27 NOTE — ED Notes (Signed)
Patient transported to CT 

## 2018-12-27 NOTE — Progress Notes (Signed)
Pharmacy Antibiotic Note  Arthur Mills is a 28 y.o. male presented to [facility] on 8/18 with c/o scrotal pain and swelling and discharged with prescription for keflex. He presented back to [facility] on 8/22 with c/o scrotum abscess. Scrotum wound culture from 8/18 came back on 8/21 with MRSA.  To start zosyn and vancomycin for wound infection.  - vancomycin 1000 mg x1 given in ED at ~0300 on 8/22 and zosyn given at 0200   Plan: -  Zosyn 3.375 gm IV q8h (infuse over 4 hours) - Vancomycin 1250 mg IV q12h for est AUC 439 - daily scr  ________________________________________  Height: 6\' 1"  (185.4 cm) Weight: 194 lb 9.6 oz (88.3 kg) IBW/kg (Calculated) : 79.9  Temp (24hrs), Avg:98.2 F (36.8 C), Min:97.9 F (36.6 C), Max:98.6 F (37 C)  Recent Labs  Lab 12/27/18 0153  WBC 8.3  CREATININE 1.24    Estimated Creatinine Clearance: 102.9 mL/min (by C-G formula based on SCr of 1.24 mg/dL).    Allergies  Allergen Reactions  . Isovue [Iopamidol]     02/16/2016 pt had vomiting and diffuse redness with hives/itching immediately after receiving 47cc iv isovue 300.     Thank you for allowing pharmacy to be a part of this patient's care.  Lynelle Doctor 12/27/2018 9:50 AM

## 2018-12-27 NOTE — ED Notes (Signed)
Was only able to obtain 1 set of blood cultures prior to ABX.

## 2018-12-27 NOTE — ED Notes (Signed)
Attempted to call report. Number left for callback

## 2018-12-27 NOTE — ED Notes (Signed)
Attempted to call report

## 2018-12-27 NOTE — ED Notes (Signed)
[name] (patients Father) - [phone]

## 2018-12-27 NOTE — ED Notes (Signed)
RN attempted to start IV without success  

## 2018-12-27 NOTE — ED Notes (Signed)
[facility] notified (Kim) - patient ready for transport 

## 2018-12-27 NOTE — Plan of Care (Signed)

## 2018-12-27 NOTE — ED Notes (Signed)
Minette Brine, Rn In ed took over care

## 2018-12-27 NOTE — Telephone Encounter (Signed)
Post ED Visit - Positive Culture Follow-up  Culture report reviewed by antimicrobial stewardship pharmacist: [place] Team []  Elenor Quinones, Pharm.D. []  Heide Guile, Pharm.D., BCPS AQ-ID []  Parks Neptune, Pharm.D., BCPS []  Alycia Rossetti, Pharm.D., BCPS []  [place], Pharm.D., BCPS, AAHIVP []  Legrand Como, Pharm.D., BCPS, AAHIVP [x]  Salome Arnt, PharmD, BCPS []  Johnnette Gourd, PharmD, BCPS []  Hughes Better, PharmD, BCPS []  Leeroy Cha, PharmD []  Laqueta Linden, PharmD, BCPS []  Albertina Parr, PharmD  [facility] Team []  Leodis Sias, PharmD []  Lindell Spar, PharmD []  Royetta Asal, PharmD []  Graylin Shiver, Rph []  Rema Fendt) Glennon Mac, PharmD []  Arlyn Dunning, PharmD []  Netta Cedars, PharmD []  Dia Sitter, PharmD []  Leone Haven, PharmD []  Gretta Arab, PharmD []  Theodis Shove, PharmD []  Peggyann Juba, PharmD []  Reuel Boom, PharmD   Positive aerobic culture Treated with Cephalexin, organism sensitive to the same and no further patient follow-up is required at this time.  [place] 12/27/2018, 2:04 PM

## 2018-12-27 NOTE — ED Notes (Signed)
Attempted to call report. Number left for callback 

## 2018-12-27 NOTE — Consult Note (Signed)
Urology Consult  Consulting MD: Dow Adolpharole Hall, DO  CC: Scrotal abscess  HPI: This is a [age]year old male admitted by hospital service for a left scrotal abscess.  This presented several days ago and he had this drained at [facility] on the 18th of this month.  He had a small amount of packing placed and he was sent home on cephalexin.  He subsequently grew a methicillin-resistant staph.  The patient was taking a shower yesterday and felt that the amount of packing that was placed was not all removed.  He subsequently went to the emergency room at [facility].  CT scan was performed revealing small abscess cavity.  It was recommended that he have this evaluated as there was a worry about retained packing.  The patient does state that he has much less swelling since the abscess was drained.  There is been no recent fever or chills.  PMH: Past Medical History:  Diagnosis Date  . Frequent headaches   . History of chicken pox   . History of kidney stones 2012  . Migraine     PSH: Past Surgical History:  Procedure Laterality Date  . APPENDECTOMY    . STOMACH SURGERY  "middle school'   repair of "malrotation of stomach"    Allergies: Allergies  Allergen Reactions  . Isovue [Iopamidol]     02/16/2016 pt had vomiting and diffuse redness with hives/itching immediately after receiving 47cc iv isovue 300.    Medications: Medications Prior to Admission  Medication Sig Dispense Refill Last Dose  . cephALEXin (KEFLEX) 500 MG capsule Take 1 capsule (500 mg total) by mouth 4 (four) times daily. 28 capsule 0 12/26/2018 at Unknown time  . ibuprofen (ADVIL) 200 MG tablet Take 200 mg by mouth every 6 (six) hours as needed for moderate pain.   12/26/2018 at 7pm  . escitalopram (LEXAPRO) 10 MG tablet Take 1 tablet (10 mg total) by mouth daily. (Patient not taking: Reported on 12/27/2018) 30 tablet 3 Not Taking at Unknown time  . HYDROcodone-acetaminophen (NORCO/VICODIN) 5-325 MG  tablet Take 1 tablet by mouth every 6 (six) hours as needed. (Patient not taking: Reported on 12/27/2018) 5 tablet 0 Not Taking at Unknown time  . oxyCODONE-acetaminophen (PERCOCET/ROXICET) 5-325 MG tablet Take 1-2 tablets by mouth every 6 (six) hours as needed for severe pain. (Patient not taking: Reported on 12/27/2018) 10 tablet 0 Not Taking at Unknown time     Social History: Social History   Socioeconomic History  . Marital status: Single    Spouse name: Not on file  . Number of children: 0  . Years of education: Not on file  . Highest education level: Not on file  Occupational History  . Occupation: Training and development officerConstruction  Social Needs  . Financial resource strain: Not on file  . Food insecurity    Worry: Not on file    Inability: Not on file  . Transportation needs    Medical: Not on file    Non-medical: Not on file  Tobacco Use  . Smoking status: Never Smoker  . Smokeless tobacco: Current User    Types: Chew  Substance and Sexual Activity  . Alcohol use: Yes    Comment: occ  . Drug use: No  . Sexual activity: Not on file  Lifestyle  . Physical activity    Days per week: Not on file    Minutes per session: Not on file  . Stress: Not on file  Relationships  .  Social Herbalist on phone: Not on file    Gets together: Not on file    Attends religious service: Not on file    Active member of club or organization: Not on file    Attends meetings of clubs or organizations: Not on file    Relationship status: Not on file  . Intimate partner violence    Fear of current or ex partner: Not on file    Emotionally abused: Not on file    Physically abused: Not on file    Forced sexual activity: Not on file  Other Topics Concern  . Not on file  Social History Narrative  . Not on file    Family History: Family History  Problem Relation Age of Onset  . Diabetes Paternal Grandmother   . Hypertension Paternal Grandmother   . Stroke Paternal Grandmother   . Alcohol  abuse Maternal Uncle     Review of Systems: Positive: Scrotal swelling and pain, improving Negative: No fever or chills.  No difficulty urinating..  A further 10 point review of systems was negative except what is listed in the HPI.  Physical Exam: @VITALS2 @ General: No acute distress.  Awake. Head:  Normocephalic.  Atraumatic. ENT:  EOMI.  Mucous membranes moist Neck:  Supple.  No lymphadenopathy. CV:  Regular rate. Pulmonary: Equal effort bilaterally.   Abdomen: Soft.  Non-tender to palpation. Skin:  Normal turgor.  No visible rash. Extremity: No gross deformity of upper extremities.  No gross deformity of lower extremities. Neurologic: Alert. Appropriate mood.  Penis:  circumcised.  No lesions. Urethra: Orthotopic meatus. Scrotum: There is a draining abscess on the left superior anterior scrotum.  Minimal induration.  There was a fair amount of pus that was liberated by compressing the edges of the wound.  I do not feel any significant evidence of retained packing. Testicles: Descended bilaterally.  No masses bilaterally. Epididymis: Palpable bilaterally. Non Tender to palpation.  Studies:  Recent Labs    12/27/18 0153  HGB 16.2  WBC 8.3  PLT 290    Recent Labs    12/27/18 0153  NA 139  K 3.8  CL 100  CO2 28  BUN 12  CREATININE 1.24  CALCIUM 9.6  GFRNONAA >60  GFRAA >60     No results for input(s): INR, APTT in the last 72 hours.  Invalid input(s): PT   Invalid input(s): ABG  I reviewed the patient's CT images.  There is no evidence of subcutaneous emphysema.  I do not see any evidence of retained packing material.  Assessment: Scrotal abscess, status post incision and drainage on 8.18.2020.Marland Kitchen  He grew methicillin-resistant staph.  He was placed on Keflex which most likely will not cover this organism.  I do not think he has retained packing material, and I think overall he is progressing well.  Plan: I agree with current coverage with Zosyn and  vancomycin  If he looks stable in the morning, I think it is fine to let him go home.  I would recommend sending him home on doxycycline or something to cover his MRSA.  Currently, I taught him how to do wound care i.e. swabbed the cavity with a moistened Q-tip 2-3 times a day.  We will start that while he is in the hospital     [phone]

## 2018-12-27 NOTE — ED Provider Notes (Signed)
[facility] EMERGENCY DEPARTMENT Provider Note   CSN: [id number] Arrival date & time: 12/26/18  2306     History   Chief Complaint Chief Complaint  Patient presents with   Abscess    HPI Arthur Mills is a 28 y.o. male.     The history is provided by the patient.  Abscess Location:  Pelvis Pelvic abscess location:  Scrotum Abscess quality: draining, induration, painful and redness   Progression:  Worsening Pain details:    Quality:  Dull   Severity:  Severe   Timing:  Constant   Progression:  Unchanged Chronicity:  Recurrent Context: not diabetes and not immunosuppression   Relieved by:  Nothing Worsened by:  Nothing Ineffective treatments:  None tried Associated symptoms: no anorexia, no fever and no vomiting   Risk factors: prior abscess     Past Medical History:  Diagnosis Date   Frequent headaches    History of chicken pox    History of kidney stones 2012   Migraine     Patient Active Problem List   Diagnosis Date Noted   Chronic back pain 07/03/2015   Tachycardia 07/03/2015   Chronic migraine without aura without status migrainosus, not intractable 07/03/2015    Past Surgical History:  Procedure Laterality Date   APPENDECTOMY     STOMACH SURGERY  "middle school'   repair of "malrotation of stomach"        Home Medications    Prior to Admission medications   Medication Sig Start Date End Date Taking? Authorizing Provider  cephALEXin (KEFLEX) 500 MG capsule Take 1 capsule (500 mg total) by mouth 4 (four) times daily. 12/23/18   Terrilee FilesButler, Michael C, MD  escitalopram (LEXAPRO) 10 MG tablet Take 1 tablet (10 mg total) by mouth daily. 11/15/17   Waldon MerlMartin, William C, PA-C  HYDROcodone-acetaminophen (NORCO/VICODIN) 5-325 MG tablet Take 1 tablet by mouth every 6 (six) hours as needed. 11/24/18   Cristina GongHammond, Elizabeth W, PA-C  oxyCODONE-acetaminophen (PERCOCET/ROXICET) 5-325 MG tablet Take 1-2 tablets by mouth every 6 (six) hours as needed  for severe pain. 12/23/18   Terrilee FilesButler, Michael C, MD    Family History Family History  Problem Relation Age of Onset   Diabetes Paternal Grandmother    Hypertension Paternal Grandmother    Stroke Paternal Grandmother    Alcohol abuse Maternal Uncle     Social History Social History   Tobacco Use   Smoking status: Never Smoker   Smokeless tobacco: Current User    Types: Chew  Substance Use Topics   Alcohol use: Yes    Comment: occ   Drug use: No     Allergies   Isovue [iopamidol]   Review of Systems Review of Systems  Constitutional: Negative for fever.  HENT: Negative for facial swelling.   Eyes: Negative for visual disturbance.  Respiratory: Negative for cough and wheezing.   Cardiovascular: Negative for chest pain.  Gastrointestinal: Negative for anorexia and vomiting.  Genitourinary: Positive for scrotal swelling and testicular pain.  Musculoskeletal: Negative for arthralgias.  Skin: Positive for color change and wound. Negative for pallor.  Neurological: Negative for dizziness.  Psychiatric/Behavioral: Negative for agitation.  All other systems reviewed and are negative.    Physical Exam Updated Vital Signs BP (!) 115/57 (BP Location: Left Arm)    Pulse 68    Temp 98.1 F (36.7 C) (Oral)    Resp 14    Ht 6\' 1"  (1.854 m)    Wt 91 kg    SpO2  96%    BMI 26.47 kg/m   Physical Exam Vitals signs and nursing note reviewed.  Constitutional:      Appearance: Normal appearance. He is not ill-appearing.  HENT:     Head: Normocephalic and atraumatic.     Nose: Nose normal.  Eyes:     Conjunctiva/sclera: Conjunctivae normal.     Pupils: Pupils are equal, round, and reactive to light.  Neck:     Musculoskeletal: Normal range of motion and neck supple.  Cardiovascular:     Rate and Rhythm: Normal rate and regular rhythm.     Pulses: Normal pulses.     Heart sounds: Normal heart sounds.  Pulmonary:     Effort: Pulmonary effort is normal.     Breath  sounds: Normal breath sounds.  Abdominal:     General: Abdomen is flat. Bowel sounds are normal.     Tenderness: There is no abdominal tenderness. There is no guarding.  Genitourinary:   Neurological:     Mental Status: He is alert.      ED Treatments / Results  Labs (all labs ordered are listed, but only abnormal results are displayed) Results for orders placed or performed during the hospital encounter of 12/27/18  SARS Coronavirus 2 [facility](Hospital order, Performed [facility]) Nasopharyngeal Nasopharyngeal Swab   Specimen: Nasopharyngeal Swab  Result Value Ref Range   SARS Coronavirus 2 NEGATIVE NEGATIVE  CBC with Differential/Platelet  Result Value Ref Range   WBC 8.3 4.0 - 10.5 K/uL   RBC 5.73 4.22 - 5.81 MIL/uL   Hemoglobin 16.2 13.0 - 17.0 g/dL   HCT 40.951.0 81.139.0 - 91.452.0 %   MCV 89.0 80.0 - 100.0 fL   MCH 28.3 26.0 - 34.0 pg   MCHC 31.8 30.0 - 36.0 g/dL   RDW 78.212.0 95.611.5 - 21.315.5 %   Platelets 290 150 - 400 K/uL   nRBC 0.0 0.0 - 0.2 %   Neutrophils Relative % 54 %   Neutro Abs 4.6 1.7 - 7.7 K/uL   Lymphocytes Relative 32 %   Lymphs Abs 2.6 0.7 - 4.0 K/uL   Monocytes Relative 9 %   Monocytes Absolute 0.7 0.1 - 1.0 K/uL   Eosinophils Relative 3 %   Eosinophils Absolute 0.2 0.0 - 0.5 K/uL   Basophils Relative 1 %   Basophils Absolute 0.1 0.0 - 0.1 K/uL   Immature Granulocytes 1 %   Abs Immature Granulocytes 0.06 0.00 - 0.07 K/uL  Basic metabolic panel  Result Value Ref Range   Sodium 139 135 - 145 mmol/L   Potassium 3.8 3.5 - 5.1 mmol/L   Chloride 100 98 - 111 mmol/L   CO2 28 22 - 32 mmol/L   Glucose, Bld 83 70 - 99 mg/dL   BUN 12 6 - 20 mg/dL   Creatinine, Ser 0.861.24 0.61 - 1.24 mg/dL   Calcium 9.6 8.9 - 57.810.3 mg/dL   GFR calc non Af Amer >60 >60 mL/min   GFR calc Af Amer >60 >60 mL/min   Anion gap 11 5 - 15   Ct Abdomen Pelvis Wo Contrast  Result Date: 12/27/2018 CLINICAL DATA:  Scrotal abscess. Patient reports packing is still in the wound.  Persistent pain. EXAM: CT ABDOMEN AND PELVIS WITHOUT CONTRAST TECHNIQUE: Multidetector CT imaging of the abdomen and pelvis was performed following the standard protocol without IV contrast. COMPARISON:  None. FINDINGS: Lower chest: Lung bases are clear. Hepatobiliary: No focal liver abnormality is seen. No gallstones, gallbladder wall thickening,  or biliary dilatation. Pancreas: No ductal dilatation or inflammation. Spleen: Normal in size without focal abnormality. Adrenals/Urinary Tract: Normal adrenal glands. Kidneys are symmetric in size without stones or hydronephrosis. There is no perinephric stranding. Both ureters are decompressed without stones along the course. Urinary bladder is nondistended and not well evaluated. Stomach/Bowel: Ingested material distends the stomach. The small bowel is located in the right abdomen. Cecum is high-riding in the right upper quadrant. Appendix not visualized. 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 No bowel wall thickening or inflammatory change. Vascular/Lymphatic: Abdominal aorta is normal in caliber. Small retroperitoneal nodes not enlarged by size criteria. Prominent bilateral external iliac and inguinal nodes. Reproductive: Prostate is unremarkable. Left scrotal skin thickening and soft tissue edema. No evidence of focal fluid collection. No soft tissue air. Packing material not well visualized by CT. Edema tracks into the left inguinal region. Other: No intra-abdominopelvic fluid collection. No free air. Musculoskeletal: There are no acute or suspicious osseous abnormalities. IMPRESSION: 1. Left scrotal skin thickening and soft tissue edema consistent with cellulitis. No focal fluid collection or soft tissue air. No packing material is visualized by CT. 2. Prominent bilateral external iliac and inguinal nodes are likely reactive. Electronically Signed   By: Keith Rake M.D.   On: 12/27/2018 02:08    Radiology Ct Abdomen Pelvis Wo  Contrast  Result Date: 12/27/2018 CLINICAL DATA:  Scrotal abscess. Patient reports packing is still in the wound. Persistent pain. EXAM: CT ABDOMEN AND PELVIS WITHOUT CONTRAST TECHNIQUE: Multidetector CT imaging of the abdomen and pelvis was performed following the standard protocol without IV contrast. COMPARISON:  None. FINDINGS: Lower chest: Lung bases are clear. Hepatobiliary: No focal liver abnormality is seen. No gallstones, gallbladder wall thickening, or biliary dilatation. Pancreas: No ductal dilatation or inflammation. Spleen: Normal in size without focal abnormality. Adrenals/Urinary Tract: Normal adrenal glands. Kidneys are symmetric in size without stones or hydronephrosis. There is no perinephric stranding. Both ureters are decompressed without stones along the course. Urinary bladder is nondistended and not well evaluated. Stomach/Bowel: Ingested material distends the stomach. The small bowel is located in the right abdomen. Cecum is high-riding in the right upper quadrant. Appendix not visualized. 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 No bowel wall thickening or inflammatory change. Vascular/Lymphatic: Abdominal aorta is normal in caliber. Small retroperitoneal nodes not enlarged by size criteria. Prominent bilateral external iliac and inguinal nodes. Reproductive: Prostate is unremarkable. Left scrotal skin thickening and soft tissue edema. No evidence of focal fluid collection. No soft tissue air. Packing material not well visualized by CT. Edema tracks into the left inguinal region. Other: No intra-abdominopelvic fluid collection. No free air. Musculoskeletal: There are no acute or suspicious osseous abnormalities. IMPRESSION: 1. Left scrotal skin thickening and soft tissue edema consistent with cellulitis. No focal fluid collection or soft tissue air. No packing material is visualized by CT. 2. Prominent bilateral external iliac and inguinal nodes are likely reactive.  Electronically Signed   By: Keith Rake M.D.   On: 12/27/2018 02:08    Procedures Procedures (including critical care time)  Medications Ordered in ED Medications  vancomycin (VANCOCIN) IVPB 1000 mg/200 mL premix (1,000 mg Intravenous New Bag/Given 12/27/18 0245)  0.9 %  sodium chloride infusion (500 mLs Intravenous New Bag/Given 12/27/18 0209)  Tdap (BOOSTRIX) injection 0.5 mL (has no administration in time range)  ketorolac (TORADOL) 30 MG/ML injection 15 mg (has no administration in time range)  piperacillin-tazobactam (ZOSYN) IVPB 3.375 g (0 g Intravenous Stopped 12/27/18  [id number])   Case d/w Dr. Retta Dionesahlstedt who will see the patient in consult.  Please keep NPO   Arthur MochaJustin Mcclenny was evaluated in Emergency Department on 12/27/2018 for the symptoms described in the history of present illness. He was evaluated in the context of the global COVID-19 pandemic, which necessitated consideration that the patient might be at risk for infection with the SARS-CoV-2 virus that causes COVID-19. Institutional protocols and algorithms that pertain to the evaluation of patients at risk for COVID-19 are in a state of rapid change based on information released by regulatory bodies including the CDC and federal and state organizations. These policies and algorithms were followed during the patient's care in the ED.  Final Clinical Impressions(s) / ED Diagnoses   Final diagnoses:  Cellulitis, unspecified cellulitis site    Will admit to medicine.  Will keep NPO for procedure.  Saline 125 cc/hr.     Calvin Chura, MD 12/27/18 [phone]

## 2018-12-27 NOTE — ED Notes (Signed)
ED TO INPATIENT HANDOFF REPORT  ED Nurse Name and Phone #:  Margie BilletMatt C [phone]  S Name/Age/Gender Arthur MochaJustin Mills 28 y.o. male Room/Bed: MH12/MH12  Code Status   Code Status: Not on file  Home/SNF/Other Home Patient oriented to: self, place, time and situation Is this baseline? Yes   Triage Complete: Triage complete  Chief Complaint wound check  Triage Note Patient presents with complaints of abscess to scrotum; states seen here 4 days ago for same and states partial packing still in wound. States pain is not improving.    Allergies Allergies  Allergen Reactions  . Isovue [Iopamidol]     02/16/2016 pt had vomiting and diffuse redness with hives/itching immediately after receiving 47cc iv isovue 300.    Level of Care/Admitting Diagnosis ED Disposition    ED Disposition Condition Comment   Admit  [facility] [[id number]  Level of Care: Med-Surg [16]  Covid Evaluation: Confirmed COVID Negative  Diagnosis: Scrotal abscess [[id number]][[id number]]  Admitting Physician: Rometta EmeryGARBA, MOHAMMAD L [[id number]]  Attending Physician: Rometta EmeryGARBA, MOHAMMAD L [[id number]]  Estimated length of stay: past midnight tomorrow  Certification:: I certify this patient will need inpatient services for at least 2 midnights  PT Class (Do Not Modify): Inpatient [101]  PT Acc Code (Do Not Modify): Private [1]       B Medical/Surgery History Past Medical History:  Diagnosis Date  . Frequent headaches   . History of chicken pox   . History of kidney stones 2012  . Migraine    Past Surgical History:  Procedure Laterality Date  . APPENDECTOMY    . STOMACH SURGERY  "middle [employer]'   repair of "malrotation of stomach"     A IV Location/Drains/Wounds Patient Lines/Drains/Airways Status   Active Line/Drains/Airways    Name:   Placement date:   Placement time:   Site:   Days:   Peripheral IV 12/27/18 Right Antecubital   12/27/18    0150    Antecubital   less than 1           Intake/Output Last 24 hours  Intake/Output Summary (Last 24 hours) at 12/27/2018 0505 Last data filed at 12/27/2018 0349 Gross per 24 hour  Intake 251.48 ml  Output -  Net 251.48 ml    Labs/Imaging Results for orders placed or performed during the hospital encounter of 12/27/18 (from the past 48 hour(s))  CBC with Differential/Platelet     Status: None   Collection Time: 12/27/18  1:53 AM  Result Value Ref Range   WBC 8.3 4.0 - 10.5 K/uL   RBC 5.73 4.22 - 5.81 MIL/uL   Hemoglobin 16.2 13.0 - 17.0 g/dL   HCT 01.051.0 93.239.0 - 35.552.0 %   MCV 89.0 80.0 - 100.0 fL   MCH 28.3 26.0 - 34.0 pg   MCHC 31.8 30.0 - 36.0 g/dL   RDW 73.212.0 20.211.5 - 54.215.5 %   Platelets 290 150 - 400 K/uL   nRBC 0.0 0.0 - 0.2 %   Neutrophils Relative % 54 %   Neutro Abs 4.6 1.7 - 7.7 K/uL   Lymphocytes Relative 32 %   Lymphs Abs 2.6 0.7 - 4.0 K/uL   Monocytes Relative 9 %   Monocytes Absolute 0.7 0.1 - 1.0 K/uL   Eosinophils Relative 3 %   Eosinophils Absolute 0.2 0.0 - 0.5 K/uL   Basophils Relative 1 %   Basophils Absolute 0.1 0.0 - 0.1 K/uL   Immature Granulocytes 1 %   Abs Immature Granulocytes 0.06  0.00 - 0.07 K/uL    Comment: Performed at [facility], [address] [place], [place] [address]  Basic metabolic panel     Status: None   Collection Time: 12/27/18  1:53 AM  Result Value Ref Range   Sodium 139 135 - 145 mmol/L   Potassium 3.8 3.5 - 5.1 mmol/L   Chloride 100 98 - 111 mmol/L   CO2 28 22 - 32 mmol/L   Glucose, Bld 83 70 - 99 mg/dL   BUN 12 6 - 20 mg/dL   Creatinine, Ser 6.041.24 0.61 - 1.24 mg/dL   Calcium 9.6 8.9 - 54.010.3 mg/dL   GFR calc non Af Amer >60 >60 mL/min   GFR calc Af Amer >60 >60 mL/min   Anion gap 11 5 - 15    Comment: Performed at [facility], [address] [place], [place] [address]  SARS Coronavirus 2 [facility](Hospital order, Performed in [facility] lab) Nasopharyngeal Nasopharyngeal Swab     Status: None   Collection Time: 12/27/18  2:05 AM    Specimen: Nasopharyngeal Swab  Result Value Ref Range   SARS Coronavirus 2 NEGATIVE NEGATIVE    Comment: (NOTE) If result is NEGATIVE SARS-CoV-2 target nucleic acids are NOT DETECTED. The SARS-CoV-2 RNA is generally detectable in upper and lower  respiratory specimens during the acute phase of infection. The lowest  concentration of SARS-CoV-2 viral copies this assay can detect is 250  copies / mL. A negative result does not preclude SARS-CoV-2 infection  and should not be used as the sole basis for treatment or other  patient management decisions.  A negative result may occur with  improper specimen collection / handling, submission of specimen other  than nasopharyngeal swab, presence of viral mutation(s) within the  areas targeted by this assay, and inadequate number of viral copies  (<250 copies / mL). A negative result must be combined with clinical  observations, patient history, and epidemiological information. If result is POSITIVE SARS-CoV-2 target nucleic acids are DETECTED. The SARS-CoV-2 RNA is generally detectable in upper and lower  respiratory specimens dur ing the acute phase of infection.  Positive  results are indicative of active infection with SARS-CoV-2.  Clinical  correlation with patient history and other diagnostic information is  necessary to determine patient infection status.  Positive results do  not rule out bacterial infection or co-infection with other viruses. If result is PRESUMPTIVE POSTIVE SARS-CoV-2 nucleic acids MAY BE PRESENT.   A presumptive positive result was obtained on the submitted specimen  and confirmed on repeat testing.  While 2019 novel coronavirus  (SARS-CoV-2) nucleic acids may be present in the submitted sample  additional confirmatory testing may be necessary for epidemiological  and / or clinical management purposes  to differentiate between  SARS-CoV-2 and other Sarbecovirus currently known to infect humans.  If clinically  indicated additional testing with an alternate test  methodology [phone] is advised. The SARS-CoV-2 RNA is generally  detectable in upper and lower respiratory sp ecimens during the acute  phase of infection. The expected result is Negative. Fact Sheet for Patients:  BoilerBrush.com.cyhttps://www.fda.gov/media/136312/download Fact Sheet for Healthcare Providers: https://pope.com/https://www.fda.gov/media/136313/download This test is not yet approved or cleared by the [place] FDA and has been authorized for detection and/or diagnosis of SARS-CoV-2 by FDA under an Emergency Use Authorization (EUA).  This EUA will remain in effect (meaning this test can be used) for the duration of the COVID-19 declaration under Section 564(b)(1) of  the Act, 21 U.S.C. section 360bbb-3(b)(1), unless the authorization is terminated or revoked sooner. Performed at [facility], [address], [place], [place] [address]    Ct Abdomen Pelvis Wo Contrast  Result Date: 12/27/2018 CLINICAL DATA:  Scrotal abscess. Patient reports packing is still in the wound. Persistent pain. EXAM: CT ABDOMEN AND PELVIS WITHOUT CONTRAST TECHNIQUE: Multidetector CT imaging of the abdomen and pelvis was performed following the standard protocol without IV contrast. COMPARISON:  None. FINDINGS: Lower chest: Lung bases are clear. Hepatobiliary: No focal liver abnormality is seen. No gallstones, gallbladder wall thickening, or biliary dilatation. Pancreas: No ductal dilatation or inflammation. Spleen: Normal in size without focal abnormality. Adrenals/Urinary Tract: Normal adrenal glands. Kidneys are symmetric in size without stones or hydronephrosis. There is no perinephric stranding. Both ureters are decompressed without stones along the course. Urinary bladder is nondistended and not well evaluated. Stomach/Bowel: Ingested material distends the stomach. The small bowel is located in the right abdomen. Cecum is high-riding in the right upper quadrant.  Appendix not visualized. 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 No bowel wall thickening or inflammatory change. Vascular/Lymphatic: Abdominal aorta is normal in caliber. Small retroperitoneal nodes not enlarged by size criteria. Prominent bilateral external iliac and inguinal nodes. Reproductive: Prostate is unremarkable. Left scrotal skin thickening and soft tissue edema. No evidence of focal fluid collection. No soft tissue air. Packing material not well visualized by CT. Edema tracks into the left inguinal region. Other: No intra-abdominopelvic fluid collection. No free air. Musculoskeletal: There are no acute or suspicious osseous abnormalities. IMPRESSION: 1. Left scrotal skin thickening and soft tissue edema consistent with cellulitis. No focal fluid collection or soft tissue air. No packing material is visualized by CT. 2. Prominent bilateral external iliac and inguinal nodes are likely reactive. Electronically Signed   By: Keith Rake M.D.   On: 12/27/2018 02:08    Pending Labs Unresulted Labs (From admission, onward)    Start     Ordered   12/27/18 0111  Blood culture (routine x 2)  BLOOD CULTURE X 2,   STAT     12/27/18 0110          Vitals/Pain Today's Vitals   12/26/18 2315 12/26/18 2318 12/27/18 0320 12/27/18 0350  BP:  115/74 (!) 115/57   Pulse:  86 68   Resp:  16 14   Temp:  98.6 F (37 C) 98.1 F (36.7 C)   TempSrc:  Oral Oral   SpO2:  94% 96%   Weight: 91 kg     Height: 6\' 1"  (1.854 m)     PainSc: 7    5     Isolation Precautions No active isolations  Medications Medications  0.9 %  sodium chloride infusion ( Intravenous Stopped 12/27/18 0349)  0.9 %  sodium chloride infusion (450 mLs Intravenous New Bag/Given 12/27/18 0350)  vancomycin (VANCOCIN) IVPB 1000 mg/200 mL premix (0 mg Intravenous Stopped 12/27/18 0345)  piperacillin-tazobactam (ZOSYN) IVPB 3.375 g (0 g Intravenous Stopped 12/27/18 0239)  Tdap (BOOSTRIX) injection 0.5 mL (0.5  mLs Intramuscular Given 12/27/18 0351)  ketorolac (TORADOL) 30 MG/ML injection 15 mg (15 mg Intravenous Given 12/27/18 0351)    Mobility walks Low fall risk   Focused Assessments Cardiac Assessment Handoff:    No results found for: CKTOTAL, CKMB, CKMBINDEX, TROPONINI No results found for: DDIMER Does the Patient currently have chest pain? No      R Recommendations: See Admitting Provider Note  Report given to:  Additional Notes:  Groin abscess/cellulitis

## 2018-12-27 NOTE — H&P (Signed)
History and Physical  Arthur Mills DOB: 01/16/1991 DOA: 12/27/2018  Referring physician: Carry over from Dr. Colin BentonGarba, M. PCP: Waldon MerlMartin, William C, PA-Mills  Outpatient Specialists: None Patient coming from: Home  Chief Complaint: Left scrotal pain and discharge.  HPSherren Mocha: Arthur MochaJustin Mills is a 28 year old male with previous history of anxiety and depression no longer on escitalopram who [facility] with left scrotal abscess.  He had I&D with packing done in the ED on 12/23/2018 then discharged to home with oral Keflex which he took without interruption.  At home he continued to have pain and discharge with foul odor from his left scrotum.  [facility].  Urology was consulted and [facility] and [facility] for possible I&D in the OR.  Patient denies fevers and chills but admits to night sweats.  Also intermittent watery stools with no abdominal cramping or nausea which he attributes to being on oral antibiotics.  Blood cultures x2 obtained and pending.  Wound culture taken on 12/23/18 grew MRSA.  Patient started on IV vancomycin and added Zosyn for broader coverage.  COVID-19 negative.  ED Course: [facility]  Review of Systems: Review of systems as noted in the HPI. All other systems reviewed and are negative.   Past Medical History:  Diagnosis Date  . Frequent headaches   . History of chicken pox   . History of kidney stones 2012  . Migraine    Past Surgical History:  Procedure Laterality Date  . APPENDECTOMY    . STOMACH SURGERY  "middle school'   repair of "malrotation of stomach"    Social History:  reports that he has never smoked. His smokeless tobacco use includes chew. He reports current alcohol use. He reports that he does not use drugs.   Allergies  Allergen Reactions  . Isovue [Iopamidol]     02/16/2016 pt had vomiting and diffuse redness with hives/itching immediately after  receiving 47cc iv isovue 300.    Family History  Problem Relation Age of Onset  . Diabetes Paternal Grandmother   . Hypertension Paternal Grandmother   . Stroke Paternal Grandmother   . Alcohol abuse Maternal Uncle      Prior to Admission medications   Medication Sig Start Date End Date Taking? Authorizing Provider  cephALEXin (KEFLEX) 500 MG capsule Take 1 capsule (500 mg total) by mouth 4 (four) times daily. 12/23/18   Arthur FilesButler, Michael C, MD  escitalopram (LEXAPRO) 10 MG tablet Take 1 tablet (10 mg total) by mouth daily. 11/15/17   Waldon MerlMartin, William C, PA-Mills  HYDROcodone-acetaminophen (NORCO/VICODIN) 5-325 MG tablet Take 1 tablet by mouth every 6 (six) hours as needed. 11/24/18   Arthur GongHammond, Elizabeth W, PA-Mills  oxyCODONE-acetaminophen (PERCOCET/ROXICET) 5-325 MG tablet Take 1-2 tablets by mouth every 6 (six) hours as needed for severe pain. 12/23/18   Arthur FilesButler, Michael C, MD    Physical Exam: BP 119/73 (BP Location: Right Arm)   Pulse (!) 52   Temp 97.9 F (36.6 Mills) (Oral)   Resp 16   Ht 6\' 1"  (1.854 m)   Wt 88.3 kg   SpO2 95%   BMI 25.67 kg/m   . General: 28 y.o. year-old male well developed well nourished in no acute distress.  Alert and oriented x3. . Cardiovascular: Regular rate and rhythm with no rubs or gallops.  No thyromegaly or JVD noted.  No lower extremity edema. 2/4 pulses in all 4 extremities. Marland Kitchen. Respiratory: Clear  to auscultation with no wheezes or rales. Good inspiratory effort. . Abdomen: Soft nontender nondistended with normal bowel sounds x4 quadrants. . Muskuloskeletal: No cyanosis, clubbing or edema noted bilaterally . Neuro: CN II-XII intact, strength, sensation, reflexes . Skin: No ulcerative lesions noted or rashes.  Swelling of left scrotum with bloody discharge. Marland Kitchen Psychiatry: Judgement and insight appear normal. Mood is appropriate for condition and setting          Labs on Admission:  Basic Metabolic Panel: Recent Labs  Lab 12/27/18 0153  NA 139  K 3.8  CL  100  CO2 28  GLUCOSE 83  BUN 12  CREATININE 1.24  CALCIUM 9.6   Liver Function Tests: No results for input(s): AST, ALT, ALKPHOS, BILITOT, PROT, ALBUMIN in the last 168 hours. No results for input(s): LIPASE, AMYLASE in the last 168 hours. No results for input(s): AMMONIA in the last 168 hours. CBC: Recent Labs  Lab 12/27/18 0153  WBC 8.3  NEUTROABS 4.6  HGB 16.2  HCT 51.0  MCV 89.0  PLT 290   Cardiac Enzymes: No results for input(s): CKTOTAL, CKMB, CKMBINDEX, TROPONINI in the last 168 hours.  BNP (last 3 results) No results for input(s): BNP in the last 8760 hours.  ProBNP (last 3 results) No results for input(s): PROBNP in the last 8760 hours.  CBG: Recent Labs  Lab 12/23/18 2229  GLUCAP 98    Radiological Exams on Admission: Ct Abdomen Pelvis Wo Contrast  Result Date: 12/27/2018 CLINICAL DATA:  Scrotal abscess. Patient reports packing is still in the wound. Persistent pain. EXAM: CT ABDOMEN AND PELVIS WITHOUT CONTRAST TECHNIQUE: Multidetector CT imaging of the abdomen and pelvis was performed following the standard protocol without IV contrast. COMPARISON:  None. FINDINGS: Lower chest: Lung bases are clear. Hepatobiliary: No focal liver abnormality is seen. No gallstones, gallbladder wall thickening, or biliary dilatation. Pancreas: No ductal dilatation or inflammation. Spleen: Normal in size without focal abnormality. Adrenals/Urinary Tract: Normal adrenal glands. Kidneys are symmetric in size without stones or hydronephrosis. There is no perinephric stranding. Both ureters are decompressed without stones along the course. Urinary bladder is nondistended and not well evaluated. Stomach/Bowel: Ingested material distends the stomach. The small bowel is located in the right abdomen. Cecum is high-riding in the right upper quadrant. Appendix not visualized. 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 No bowel wall thickening or inflammatory change.  Vascular/Lymphatic: Abdominal aorta is normal in caliber. Small retroperitoneal nodes not enlarged by size criteria. Prominent bilateral external iliac and inguinal nodes. Reproductive: Prostate is unremarkable. Left scrotal skin thickening and soft tissue edema. No evidence of focal fluid collection. No soft tissue air. Packing material not well visualized by CT. Edema tracks into the left inguinal region. Other: No intra-abdominopelvic fluid collection. No free air. Musculoskeletal: There are no acute or suspicious osseous abnormalities. IMPRESSION: 1. Left scrotal skin thickening and soft tissue edema consistent with cellulitis. No focal fluid collection or soft tissue air. No packing material is visualized by CT. 2. Prominent bilateral external iliac and inguinal nodes are likely reactive. Electronically Signed   By: Keith Rake MillsD.   On: 12/27/2018 02:08    EKG: I independently viewed the EKG done and my findings are as followed: None available at the time of this visit.  Assessment/Plan Present on Admission: . Scrotal abscess  Active Problems:   Scrotal abscess  Left scrotal abscess post I&D at [facility] ED on 12/23/2018 Wound culture taken on 12/23/2018 grew MRSA Failed outpatient treatment with  Keflex Start IV vancomycin Add IV Zosyn for broader coverage Blood cultures x2 in process Currently afebrile with no leukocytosis Monitor fever curve and WBC Obtain CBC with differentials tomorrow Urology has been consulted by EDP, [facility] Possible I&D in the OR today Keep n.p.o. due to possible procedure in the OR Optimize pain control  MRSA wound infection Management as stated above Also obtain MRSA screening for colonization  Previous history of anxiety depression States he is no longer taking antidepressants Previously on escitalopram  Risks: High risk for decompensation due to MRSA wound infection initially requiring IV vancomycin.  Patient  will require at least 2 midnights for IV antibiotics infusion, post I&D monitoring on antibiotics.    DVT prophylaxis: SCDs due to planned procedure today.  Defer to urology to start chemical DVT prophylaxis if indicated.  Code Status: Full code  Family Communication: None at bedside.  Disposition Plan: Admit to MedSurg.  Consults called: Urology contacted by EDP.  Admission status: Inpatient status.    Darlin Droparole N Jovanka Westgate MD [facility] Pager [phone]  If 7PM-7AM, please contact night-coverage [email or web address] Password [email or web address]  12/27/2018, 9:48 AM

## 2018-12-28 DIAGNOSIS — B9562 Methicillin resistant Staphylococcus aureus infection as the cause of diseases classified elsewhere: Secondary | ICD-10-CM | POA: Diagnosis present

## 2018-12-28 LAB — COMPREHENSIVE METABOLIC PANEL
ALT: 34 U/L (ref 0–44)
AST: 23 U/L (ref 15–41)
Albumin: 3.2 g/dL — ABNORMAL LOW (ref 3.5–5.0)
Alkaline Phosphatase: 57 U/L (ref 38–126)
Anion gap: 9 (ref 5–15)
BUN: 9 mg/dL (ref 6–20)
CO2: 24 mmol/L (ref 22–32)
Calcium: 9 mg/dL (ref 8.9–10.3)
Chloride: 107 mmol/L (ref 98–111)
Creatinine, Ser: 1.16 mg/dL (ref 0.61–1.24)
GFR calc Af Amer: >60 mL/min (ref >60)
GFR calc non Af Amer: >60 mL/min (ref >60)
Glucose, Bld: 93 mg/dL (ref 70–99)
Potassium: 4.4 mmol/L (ref 3.5–5.1)
Sodium: 140 mmol/L (ref 135–145)
Total Bilirubin: 0.9 mg/dL (ref 0.3–1.2)
Total Protein: 6.7 g/dL (ref 6.5–8.1)

## 2018-12-28 LAB — CBC WITH DIFFERENTIAL/PLATELET
Abs Immature Granulocytes: 0.06 10*3/uL (ref 0.00–0.07)
Basophils Absolute: 0.1 10*3/uL (ref 0.0–0.1)
Basophils Relative: 1 %
Eosinophils Absolute: 0.3 10*3/uL (ref 0.0–0.5)
Eosinophils Relative: 3 %
HCT: 45.7 % (ref 39.0–52.0)
Hemoglobin: 14.5 g/dL (ref 13.0–17.0)
Immature Granulocytes: 1 %
Lymphocytes Relative: 27 %
Lymphs Abs: 2.4 10*3/uL (ref 0.7–4.0)
MCH: 28.5 pg (ref 26.0–34.0)
MCHC: 31.7 g/dL (ref 30.0–36.0)
MCV: 90 fL (ref 80.0–100.0)
Monocytes Absolute: 0.8 10*3/uL (ref 0.1–1.0)
Monocytes Relative: 9 %
Neutro Abs: 5.1 10*3/uL (ref 1.7–7.7)
Neutrophils Relative %: 59 %
Platelets: 280 10*3/uL (ref 150–400)
RBC: 5.08 MIL/uL (ref 4.22–5.81)
RDW: 11.8 % (ref 11.5–15.5)
WBC: 8.6 10*3/uL (ref 4.0–10.5)
nRBC: 0 % (ref 0.0–0.2)

## 2018-12-28 MED ORDER — DOXYCYCLINE HYCLATE 100 MG PO TABS: 100.0000 mg | ORAL_TABLET | Freq: Two times a day (BID) | ORAL | 0 refills | Status: DC

## 2018-12-28 NOTE — Discharge Summary (Signed)
Arthur Mills, is a 28 y.o. male  DOB 03/28/1991  MRN [id number].  Admission date:  12/27/2018  Admitting Physician  Darlin Droparole N Hall, DO  Discharge Date:  12/28/2018   Primary MD  Waldon MerlMartin, William C, PA-C  Recommendations for primary care physician for things to follow:   1) avoid excessive sun exposure while taking doxycycline antibiotic 2) swab and clean scrotal wound as advised 3) follow-up with PCP for recheck and reevaluation in about a week 4) call or return if you develop persistent diarrhea   Admission Diagnosis  Lymphadenitis [I88.9] Cellulitis, unspecified cellulitis site [L03.90]   Discharge Diagnosis  Lymphadenitis [I88.9] Cellulitis, unspecified cellulitis site [L03.90]    Principal Problem:   Scrotal abscess Active Problems:   MRSA cellulitis/Scrotal infection      Past Medical History:  Diagnosis Date   Frequent headaches    History of chicken pox    History of kidney stones 2012   Migraine     Past Surgical History:  Procedure Laterality Date   APPENDECTOMY     STOMACH SURGERY  "middle school'   repair of "malrotation of stomach"     HPI  from the history and physical done on the day of admission:   Chief Complaint: Left scrotal pain and discharge.  HPI: Arthur Mills is a 28 year old male with previous history of anxiety and depression no longer on escitalopram who [facility] with left scrotal abscess.  He had I&D with packing done in the ED on 12/23/2018 then discharged to home with oral Keflex which he took without interruption.  At home he continued to have pain and discharge with foul odor from his left scrotum.  Presented [facility] to [facility] with same complaints.  Urology was consulted and [facility] and [facility] for possible I&D in the OR.  Patient denies fevers and chills but admits to night sweats.  Also intermittent watery  stools with no abdominal cramping or nausea which he attributes to being on oral antibiotics.  Blood cultures x2 obtained and pending.  Wound culture taken on 12/23/18 grew MRSA.  Patient started on IV vancomycin and added Zosyn for broader coverage.  COVID-19 negative.  ED Course: Direct admit[facility].       Hospital Course:    1) MRSA abscess and cellulitis of the scrotum--- status post I&D on 12/23/2018 with wound cultures using MRSA sensitive to tetracycline---  -Overall much improved, no fevers, no leukocytosis -PTA patient failed outpatient Keflex,  -patient was treated as inpatient with Vanco and Zosyn, okay to discharge on doxycycline -Urology consult appreciated -Continue local wound care and follow-up as advised  Discharge Condition: stable  Follow UP  Follow-up Information    Marcine Matarahlstedt, Stephen, MD Follow up.   Specialty: Urology Why: Call for follow-up if any post hospital problems Contact information: [address] [place] [address] [phone]            Consults obtained - urology  Diet and Activity recommendation:  As advised  Discharge Instructions     Discharge Instructions    Call MD for:  difficulty breathing, headache or visual disturbances   Complete by: As directed    Call MD for:  persistant dizziness or light-headedness   Complete by: As directed    Call MD for:  persistant nausea and vomiting   Complete by: As directed    Call MD for:  severe uncontrolled pain   Complete by: As directed    Call MD for:  temperature >100.4   Complete by: As directed    Diet general   Complete by: As directed    Discharge instructions   Complete by: As directed    1) avoid excessive sun exposure while taking doxycycline antibiotic 2) swab and clean scrotal wound as advised 3) follow-up with PCP for recheck and reevaluation in about a week 4) call or return if you develop persistent diarrhea   Increase activity slowly   Complete by: As  directed         Discharge Medications     Allergies as of 12/28/2018      Reactions   Isovue [iopamidol]    02/16/2016 pt had vomiting and diffuse redness with hives/itching immediately after receiving 47cc iv isovue 300.      Medication List    TAKE these medications   cephALEXin 500 MG capsule Commonly known as: KEFLEX Take 1 capsule (500 mg total) by mouth 4 (four) times daily.   doxycycline 100 MG tablet Commonly known as: VIBRA-TABS Take 1 tablet (100 mg total) by mouth 2 (two) times daily.   escitalopram 10 MG tablet Commonly known as: LEXAPRO Take 1 tablet (10 mg total) by mouth daily.   HYDROcodone-acetaminophen 5-325 MG tablet Commonly known as: NORCO/VICODIN Take 1 tablet by mouth every 6 (six) hours as needed.   ibuprofen 200 MG tablet Commonly known as: ADVIL Take 200 mg by mouth every 6 (six) hours as needed for moderate pain.   oxyCODONE-acetaminophen 5-325 MG tablet Commonly known as: PERCOCET/ROXICET Take 1-2 tablets by mouth every 6 (six) hours as needed for severe pain.       Major procedures and Radiology Reports - PLEASE review detailed and final reports for all details, in brief -    Ct Abdomen Pelvis Wo Contrast  Result Date: 12/27/2018 CLINICAL DATA:  Scrotal abscess. Patient reports packing is still in the wound. Persistent pain. EXAM: CT ABDOMEN AND PELVIS WITHOUT CONTRAST TECHNIQUE: Multidetector CT imaging of the abdomen and pelvis was performed following the standard protocol without IV contrast. COMPARISON:  None. FINDINGS: Lower chest: Lung bases are clear. Hepatobiliary: No focal liver abnormality is seen. No gallstones, gallbladder wall thickening, or biliary dilatation. Pancreas: No ductal dilatation or inflammation. Spleen: Normal in size without focal abnormality. Adrenals/Urinary Tract: Normal adrenal glands. Kidneys are symmetric in size without stones or hydronephrosis. There is no perinephric stranding. Both ureters are  decompressed without stones along the course. Urinary bladder is nondistended and not well evaluated. Stomach/Bowel: Ingested material distends the stomach. The small bowel is located in the right abdomen. Cecum is high-riding in the right upper quadrant. Appendix not visualized. 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 No bowel wall thickening or inflammatory change. Vascular/Lymphatic: Abdominal aorta is normal in caliber. Small retroperitoneal nodes not enlarged by size criteria. Prominent bilateral external iliac and inguinal nodes. Reproductive: Prostate is unremarkable. Left scrotal skin thickening and soft tissue edema. No evidence of focal fluid collection. No soft tissue air. Packing material  not well visualized by CT. Edema tracks into the left inguinal region. Other: No intra-abdominopelvic fluid collection. No free air. Musculoskeletal: There are no acute or suspicious osseous abnormalities. IMPRESSION: 1. Left scrotal skin thickening and soft tissue edema consistent with cellulitis. No focal fluid collection or soft tissue air. No packing material is visualized by CT. 2. Prominent bilateral external iliac and inguinal nodes are likely reactive. Electronically Signed   By: Narda RutherfordMelanie  Sanford M.D.   On: 12/27/2018 02:08    Micro Results    Recent Results (from the past 240 hour(s))  Aerobic Culture (superficial specimen)     Status: None   Collection Time: 12/23/18 10:30 PM   Specimen: Scrotum; Wound  Result Value Ref Range Status   Specimen Description   Final    SCROTUM LEFT Performed at [facility] [place], [address] [place], [place] [address]    Special Requests   Final    Normal Performed at [facility] [place], [address] [place], [place] [address]    Gram Stain   Final    RARE WBC PRESENT, PREDOMINANTLY PMN FEW GRAM POSITIVE COCCI IN PAIRS Performed at [facility], [address] [place], [place] [address]    Culture   Final     MODERATE METHICILLIN RESISTANT STAPHYLOCOCCUS AUREUS   Report Status 12/26/2018 FINAL  Final   Organism ID, Bacteria METHICILLIN RESISTANT STAPHYLOCOCCUS AUREUS  Final      Susceptibility   Methicillin resistant staphylococcus aureus - MIC*    CIPROFLOXACIN <=0.5 SENSITIVE Sensitive     ERYTHROMYCIN >=8 RESISTANT Resistant     GENTAMICIN <=0.5 SENSITIVE Sensitive     OXACILLIN >=4 RESISTANT Resistant     TETRACYCLINE <=1 SENSITIVE Sensitive     VANCOMYCIN <=0.5 SENSITIVE Sensitive     TRIMETH/SULFA <=10 SENSITIVE Sensitive     CLINDAMYCIN <=0.25 SENSITIVE Sensitive     RIFAMPIN <=0.5 SENSITIVE Sensitive     Inducible Clindamycin NEGATIVE Sensitive     * MODERATE METHICILLIN RESISTANT STAPHYLOCOCCUS AUREUS  Blood culture (routine x 2)     Status: None (Preliminary result)   Collection Time: 12/27/18  1:53 AM   Specimen: BLOOD RIGHT ARM  Result Value Ref Range Status   Specimen Description   Final    BLOOD RIGHT ARM Performed at [facility] [place], [address] [place], [place] [address]    Special Requests   Final    BOTTLES DRAWN AEROBIC AND ANAEROBIC Blood Culture adequate volume Performed at [facility] [place], [address] [place], [place] [address]    Culture   Final    NO GROWTH < 24 HOURS Performed at [facility], [address] [place], [place] [address]    Report Status PENDING  Incomplete  SARS Coronavirus 2 [facility](Hospital order, Performed in [facility]) Nasopharyngeal Nasopharyngeal Swab     Status: None   Collection Time: 12/27/18  2:05 AM   Specimen: Nasopharyngeal Swab  Result Value Ref Range Status   SARS Coronavirus 2 NEGATIVE NEGATIVE Final    Comment: (NOTE) If result is NEGATIVE SARS-CoV-2 target nucleic acids are NOT DETECTED. The SARS-CoV-2 RNA is generally detectable in upper and lower  respiratory specimens during the acute phase of infection. The lowest  concentration of SARS-CoV-2 viral copies this assay can detect is  250  copies / mL. A negative result does not preclude SARS-CoV-2 infection  and should not be used as the  sole basis for treatment or other  patient management decisions.  A negative result may occur with  improper specimen collection / handling, submission of specimen other  than nasopharyngeal swab, presence of viral mutation(s) within the  areas targeted by this assay, and inadequate number of viral copies  (<250 copies / mL). A negative result must be combined with clinical  observations, patient history, and epidemiological information. If result is POSITIVE SARS-CoV-2 target nucleic acids are DETECTED. The SARS-CoV-2 RNA is generally detectable in upper and lower  respiratory specimens dur ing the acute phase of infection.  Positive  results are indicative of active infection with SARS-CoV-2.  Clinical  correlation with patient history and other diagnostic information is  necessary to determine patient infection status.  Positive results do  not rule out bacterial infection or co-infection with other viruses. If result is PRESUMPTIVE POSTIVE SARS-CoV-2 nucleic acids MAY BE PRESENT.   A presumptive positive result was obtained on the submitted specimen  and confirmed on repeat testing.  While 2019 novel coronavirus  (SARS-CoV-2) nucleic acids may be present in the submitted sample  additional confirmatory testing may be necessary for epidemiological  and / or clinical management purposes  to differentiate between  SARS-CoV-2 and other Sarbecovirus currently known to infect humans.  If clinically indicated additional testing with an alternate test  methodology [phone]) is advised. The SARS-CoV-2 RNA is generally  detectable in upper and lower respiratory sp ecimens during the acute  phase of infection. The expected result is Negative. Fact Sheet for Patients:  BoilerBrush.com.cy Fact Sheet for Healthcare  Providers: https://pope.com/ This test is not yet approved or cleared by the [place] FDA and has been authorized for detection and/or diagnosis of SARS-CoV-2 by FDA under an Emergency Use Authorization (EUA).  This EUA will remain in effect (meaning this test can be used) for the duration of the COVID-19 declaration under Section 564(b)(1) of the Act, 21 U.S.C. section 360bbb-3(b)(1), unless the authorization is terminated or revoked sooner. Performed at [facility], [address] [place], [place] [address]   MRSA PCR Screening     Status: Abnormal   Collection Time: 12/27/18  9:39 AM   Specimen: Nasal Mucosa; Nasopharyngeal  Result Value Ref Range Status   MRSA by PCR POSITIVE (A) NEGATIVE Final    Comment:        The GeneXpert MRSA Assay (FDA approved for NASAL specimens only), is one component of a comprehensive MRSA colonization surveillance program. It is not intended to diagnose MRSA infection nor to guide or monitor treatment for MRSA infections. RESULT CALLED TO, READ BACK BY AND VERIFIED WITH: SAUNDERS,L. RN @1229  ON 08.22.2020 BY COHEN,K Performed at [facility], [address], [place], [place] [address]   Surgical PCR screen     Status: Abnormal   Collection Time: 12/27/18  9:50 AM   Specimen: Nasal Mucosa; Nasal Swab  Result Value Ref Range Status   MRSA, PCR POSITIVE (A) NEGATIVE Final    Comment: RESULT CALLED TO, READ BACK BY AND VERIFIED WITH: SAUNDERS,L. RN @1230  ON 08.22.2020 BY COHEN,K    Staphylococcus aureus POSITIVE (A) NEGATIVE Final    Comment: (NOTE) The Xpert SA Assay (FDA approved for NASAL specimens in patients 76 years of age and older), is one component of a comprehensive surveillance program. It is not intended to diagnose infection nor to guide or monitor treatment. Performed at [facility], [address] [place], [place] [address]  Today    Subjective    Arthur Mills today has no new concerns, significant other at bedside, questions answered, no fevers no chills, scrotal swelling and drainage is improved significantly as per patient          Patient has been seen and examined prior to discharge   Objective   Blood pressure 120/70, pulse 65, temperature 98.3 F (36.8 C), temperature source Oral, resp. rate 12, height 6\' 1"  (1.854 m), weight 88.3 kg, SpO2 97 %.   Intake/Output Summary (Last 24 hours) at 12/28/2018 1343 Last data filed at 12/28/2018 1258 Gross per 24 hour  Intake 3877.28 ml  Output --  Net 3877.28 ml    Exam Gen:- Awake Alert, no acute distress  HEENT:- [place].AT, No sclera icterus Neck-Supple Neck,No JVD,.  Lungs-  CTAB , good air movement bilaterally CV- S1, S2 normal, regular Abd-  +ve B.Sounds, Abd Soft, No tenderness,    Extremity/Skin:- No  edema,   good pulses Psych-affect is appropriate, oriented x3 Neuro-no new focal deficits, no tremors  GU--much improved left-sided scrotal swelling, drainage and discomfort, patient has scant amount of mostly serosanguineous drainage at this time   Data Review   CBC w Diff:  Lab Results  Component Value Date   WBC 8.6 12/28/2018   HGB 14.5 12/28/2018   HCT 45.7 12/28/2018   PLT 280 12/28/2018   LYMPHOPCT 27 12/28/2018   MONOPCT 9 12/28/2018   EOSPCT 3 12/28/2018   BASOPCT 1 12/28/2018    CMP:  Lab Results  Component Value Date   NA 140 12/28/2018   K 4.4 12/28/2018   CL 107 12/28/2018   CO2 24 12/28/2018   BUN 9 12/28/2018   CREATININE 1.16 12/28/2018   PROT 6.7 12/28/2018   ALBUMIN 3.2 (L) 12/28/2018   BILITOT 0.9 12/28/2018   ALKPHOS 57 12/28/2018   AST 23 12/28/2018   ALT 34 12/28/2018    Total Discharge time is about 33 minutes  Roxan Hockey M.D on 12/28/2018 at 1:43 PM  Go to [email or web address] -  for contact info  [facility] - Office  [phone]

## 2018-12-28 NOTE — Progress Notes (Signed)
AVS given to patient and explained at the bedside. Medications and follow up appointments have been explained with pt verbalizing understanding. Wound care also reinforced with patient. Pt demonstrating how to perform wound care with this RN present. All questions regarding wound care answered with pt again verbalizing understanding of the procedure.

## 2018-12-28 NOTE — Progress Notes (Signed)
Patient reports much less scrotal pain.  He has been swabbing his wound without difficulty  Much less scrotal edema/induration.  Serosanguineous discharge present, no purulent drainage.  I am fine with him going home.  He knows how to swab his wound.  He can continue the Keflex as prescribed.  He can follow-up as needed.

## 2018-12-28 NOTE — Discharge Instructions (Signed)
Continue usual wound care as taught in hospital  It is okay to shower  --1) avoid excessive sun exposure while taking doxycycline antibiotic 2) swab and clean scrotal wound as advised 3) follow-up with PCP for recheck and reevaluation in about a week 4) call or return if you develop persistent diarrhea

## 2018-12-28 NOTE — Progress Notes (Signed)
Nutrition Brief Note RD working remotely.   Patient identified on the Malnutrition Screening Tool (MST) Report  Wt Readings from Last 15 Encounters:  12/27/18 88.3 kg  12/23/18 91.6 kg  11/15/17 101.6 kg  11/10/17 102.5 kg  02/18/17 104.3 kg  02/16/16 97.5 kg  09/14/15 93 kg  08/05/15 97.7 kg  07/01/15 97.3 kg  10/16/14 95.3 kg    Body mass index is 25.67 kg/m. Patient meets criteria for overweight status based on current BMI. Current weight is 195 lb and weight on 12/23/18 was 201 lb. This indicates 6 lb weight loss (3% body weight) in the past 5 days.   Patient reported diarrhea with taking abx PTA. He is currently receiving NS @ 125 ml/hr. Suspect some/most of weight loss is fluid related.   Current diet order is Regular and he is eating 100% of meals at this time. Labs and medications reviewed. Discharge order to home entered earlier this AM.   No nutrition interventions warranted at this time. If nutrition issues arise, please consult RD.      Jarome Matin, MS, RD, LDN, [facility] Inpatient Clinical Dietitian Pager # [phone] After hours/weekend pager # [phone]

## 2018-12-29 ENCOUNTER — Telehealth: Payer: Self-pay | Admitting: Physician Assistant

## 2018-12-29 NOTE — Telephone Encounter (Signed)
Arthur Mills, is a 28 y.o. male  DOB July 18, 1990  MRN [id number].  Admission date:  12/27/2018  Admitting Physician  Kayleen Memos, DO  Discharge Date:  12/28/2018   Primary MD  Brunetta Jeans, PA-C   LM to call back to schedule an hosp f/up with [facility] for in office visit per [facility] Please schedule patient for hosp F/U Friday or the following Monday.

## 2019-01-01 LAB — CULTURE, BLOOD (ROUTINE X 2)
Culture: NO GROWTH
Special Requests: ADEQUATE

## 2019-02-25 ENCOUNTER — Emergency Department (HOSPITAL_BASED_OUTPATIENT_CLINIC_OR_DEPARTMENT_OTHER)
Admission: EM | Admit: 2019-02-25 | Discharge: 2019-02-25 | Disposition: A | Discharge status: Home / Self Care | Payer: Self-pay | Attending: Emergency Medicine | Admitting: Emergency Medicine

## 2019-02-25 ENCOUNTER — Other Ambulatory Visit: Payer: Self-pay

## 2019-02-25 ENCOUNTER — Encounter (HOSPITAL_BASED_OUTPATIENT_CLINIC_OR_DEPARTMENT_OTHER): Payer: Self-pay | Admitting: *Deleted

## 2019-02-25 DIAGNOSIS — L02415 Cutaneous abscess of right lower limb: Secondary | ICD-10-CM | POA: Insufficient documentation

## 2019-02-25 DIAGNOSIS — L0291 Cutaneous abscess, unspecified: Secondary | ICD-10-CM

## 2019-02-25 MED ORDER — DOXYCYCLINE HYCLATE 100 MG PO CAPS: 100.0000 mg | ORAL_CAPSULE | Freq: Two times a day (BID) | ORAL | 0 refills | Status: DC

## 2019-02-25 NOTE — ED Provider Notes (Signed)
[facility] EMERGENCY DEPARTMENT Provider Note   CSN: [id number] Arrival date & time: 02/25/19  1321     History   Chief Complaint Chief Complaint  Patient presents with  . Abscess    HPI Arthur Mills is a 28 y.o. male.     28yo male presents with complaint of right knee pain, onset Saturday with pain, noticed a small bump on the knee on Sunday. Monday noticed 2 bumps, now with multiple bumps to the front of the knee that look infected. No fevers, no injuries, no history of STDs, no arthritis history. Pain is from mid calf to mid thigh. Previously admitted to [facility] 12/27/2018 for similar spots to groin and scrotum (MRSA positive), had fevers at that time.     Past Medical History:  Diagnosis Date  . Frequent headaches   . History of chicken pox   . History of kidney stones 2012  . Migraine     Patient Active Problem List   Diagnosis Date Noted  . MRSA cellulitis/Scrotal infection 12/28/2018  . Scrotal abscess 12/27/2018  . Chronic back pain 07/03/2015  . Tachycardia 07/03/2015  . Chronic migraine without aura without status migrainosus, not intractable 07/03/2015    Past Surgical History:  Procedure Laterality Date  . APPENDECTOMY    . STOMACH SURGERY  "middle school'   repair of "malrotation of stomach"        Home Medications    Prior to Admission medications   Medication Sig Start Date End Date Taking? Authorizing Provider  doxycycline (VIBRAMYCIN) 100 MG capsule Take 1 capsule (100 mg total) by mouth 2 (two) times daily. 02/25/19   Jeannie Fend, PA-C    Family History Family History  Problem Relation Age of Onset  . Diabetes Paternal Grandmother   . Hypertension Paternal Grandmother   . Stroke Paternal Grandmother   . Alcohol abuse Maternal Uncle     Social History Social History   Tobacco Use  . Smoking status: Never Smoker  . Smokeless tobacco: Current User    Types: Chew  Substance Use Topics  . Alcohol use: Yes    Comment:  occ  . Drug use: No     Allergies   Isovue [iopamidol]   Review of Systems Review of Systems  Constitutional: Negative for fever.  Musculoskeletal: Positive for arthralgias.  Skin: Positive for rash.  Allergic/Immunologic: Negative for immunocompromised state.  Neurological: Negative for weakness and numbness.  Psychiatric/Behavioral: Negative for confusion.  All other systems reviewed and are negative.    Physical Exam Updated Vital Signs BP 132/80 (BP Location: Left Arm)   Pulse 94   Temp 98.6 F (37 C) (Oral)   Resp 14   Ht 6\' 1"  (1.854 m)   Wt 86.2 kg   SpO2 99%   BMI 25.07 kg/m   Physical Exam Vitals signs and nursing note reviewed.  Constitutional:      General: He is not in acute distress.    Appearance: He is well-developed. He is not diaphoretic.  HENT:     Head: Normocephalic and atraumatic.  Cardiovascular:     Pulses: Normal pulses.  Pulmonary:     Effort: Pulmonary effort is normal.  Musculoskeletal:       Legs:  Skin:    General: Skin is warm and dry.     Capillary Refill: Capillary refill takes less than 2 seconds.     Findings: Erythema present.  Neurological:     Mental Status: He is alert and oriented  to person, place, and time.  Psychiatric:        Behavior: Behavior normal.    Media Information    Document Information  Photos    02/25/2019 15:46  Attached To:  Hospital Encounter on 02/25/19  Source Information  [facility]   Media Information    Document Information  Photos    02/25/2019 15:46  Attached To:  Hospital Encounter on 02/25/19  Source Information  [facility]     ED Treatments / Results  Labs (all labs ordered are listed, but only abnormal results are displayed) Labs Reviewed - No data to display  EKG None  Radiology No results found.  Procedures .Marland KitchenIncision and Drainage  Date/Time: 02/25/2019 5:07 PM Performed by:  Tacy Learn, PA-C Authorized by: Tacy Learn, PA-C   Consent:    Consent obtained:  Verbal   Consent given by:  Patient   Risks discussed:  Bleeding, incomplete drainage, pain and damage to other organs   Alternatives discussed:  No treatment Universal protocol:    Procedure explained and questions answered to patient or proxy's satisfaction: yes     Relevant documents present and verified: yes     Test results available and properly labeled: yes     Imaging studies available: yes     Required blood products, implants, devices, and special equipment available: yes     Site/side marked: yes     Immediately prior to procedure a time out was called: yes     Patient identity confirmed:  Verbally with patient Location:    Type:  Abscess   Size:  1cm x 1cm    Location:  Lower extremity   Lower extremity location:  Knee   Knee location:  R knee Pre-procedure details:    Skin preparation:  Betadine Anesthesia (see MAR for exact dosages):    Anesthesia method:  None Procedure type:    Complexity:  Simple Procedure details:    Incision types:  Stab incision   Incision depth:  Subcutaneous   Drainage:  Purulent   Drainage amount:  Scant   Wound treatment:  Wound left open   Packing materials:  None Post-procedure details:    Patient tolerance of procedure:  Tolerated well, no immediate complications Comments:     Two pustules to anterior right knee opened with blunt dissection, purulent drainage present, does not extend deeper   (including critical care time)  Medications Ordered in ED Medications - No data to display   Initial Impression / Assessment and Plan / ED Course  I have reviewed the triage vital signs and the nursing notes.  Pertinent labs & imaging results that were available during my care of the patient were reviewed by me and considered in my medical decision making (see chart for details).  Clinical Course as of Feb 24 1718  Wed Oct 21, 615  5845  28 year old male with history of MRSA presents with complaint of right knee pain with 2 pustules to the knee which have developed over the past few days.  Patient is afebrile, he is able to flex his knee although somewhat limited.  The 2 pustules were opened and drained, does not appear to track any deeper into the knee.  Erythema is noncircumferential.  Patient be started on doxycycline, advised to do warm compresses to the knee and recheck with the PCP. Patient has history of multiple abscesses over the past few months,  recommend using weekly pHisoDerm soap.  Patient has previously gone through a decolonization protocol with intranasal antibiotics.  Recommend patient discuss this further with his PCP.  Patient was given strict return to ER precautions including any worsening of pain in his knee, decreased range of motion, fevers or any other concerns.   [LM]    Clinical Course User Index [LM] Jeannie FendMurphy,  A, PA-C      Final Clinical Impressions(s) / ED Diagnoses   Final diagnoses:  Abscess    ED Discharge Orders         Ordered    doxycycline (VIBRAMYCIN) 100 MG capsule  2 times daily     02/25/19 1616           Alden HippMurphy,  A, PA-C 02/25/19 1719    Raeford RazorKohut, Stephen, MD 02/26/19 [phone]

## 2019-02-25 NOTE — ED Triage Notes (Signed)
Pt abscess to right knee x 5 days ago

## 2019-02-25 NOTE — Discharge Instructions (Addendum)
Apply warm compresses to your left knee you are soaking 1 With Epson salt water for 30 minutes at a time. Take doxycycline as prescribed and complete the full course.  Use sun precautions while taking doxycycline. Take Motrin and Tylenol as needed as directed for pain. Recheck with your provider in 2 days, return to ER for new or worsening symptoms.

## 2019-04-29 ENCOUNTER — Telehealth: Payer: Self-pay

## 2019-04-29 NOTE — Telephone Encounter (Signed)
Patient called in stating that he was seen over the summer at [facility] and [facility] for MRSA, wanting to know if Arthur Mills can do something for this instead of having to return back to the ER.   Spoke with Arthur Mills, requesting that patient have an appt.

## 2019-04-30 ENCOUNTER — Encounter (HOSPITAL_BASED_OUTPATIENT_CLINIC_OR_DEPARTMENT_OTHER): Payer: Self-pay | Admitting: Emergency Medicine

## 2019-04-30 ENCOUNTER — Other Ambulatory Visit: Payer: Self-pay

## 2019-04-30 ENCOUNTER — Emergency Department (HOSPITAL_BASED_OUTPATIENT_CLINIC_OR_DEPARTMENT_OTHER)
Admission: EM | Admit: 2019-04-30 | Discharge: 2019-04-30 | Disposition: A | Discharge status: Home / Self Care | Payer: Self-pay | Attending: Emergency Medicine | Admitting: Emergency Medicine

## 2019-04-30 DIAGNOSIS — Z79899 Other long term (current) drug therapy: Secondary | ICD-10-CM | POA: Insufficient documentation

## 2019-04-30 DIAGNOSIS — L0501 Pilonidal cyst with abscess: Secondary | ICD-10-CM | POA: Insufficient documentation

## 2019-04-30 MED ORDER — DOXYCYCLINE HYCLATE 100 MG PO CAPS: 100.0000 mg | ORAL_CAPSULE | Freq: Two times a day (BID) | ORAL | 0 refills | Status: DC

## 2019-04-30 MED ORDER — LIDOCAINE-EPINEPHRINE (PF) 2 %-1:200000 IJ SOLN
10.0000 mL | Freq: Once | INTRAMUSCULAR | Status: AC
  Administered 2019-04-30: 10 mL
  Filled 2019-04-30: qty 10

## 2019-04-30 NOTE — ED Provider Notes (Signed)
[place] EMERGENCY DEPARTMENT Provider Note   CSN: [id number] Arrival date & time: 04/30/19  6237     History Chief Complaint  Patient presents with  . Rash    Arthur Mills is a 28 y.o. male.  Patient is been struggling this past year with multiple skin cyst felt to be secondary to MRSA.  Patient now has a cyst at painful abscess is developing on the midline of the buttocks area since Tuesday.  No fevers.  No nausea or vomiting.        Past Medical History:  Diagnosis Date  . Frequent headaches   . History of chicken pox   . History of kidney stones 2012  . Migraine     Patient Active Problem List   Diagnosis Date Noted  . MRSA cellulitis/Scrotal infection 12/28/2018  . Scrotal abscess 12/27/2018  . Chronic back pain 07/03/2015  . Tachycardia 07/03/2015  . Chronic migraine without aura without status migrainosus, not intractable 07/03/2015    Past Surgical History:  Procedure Laterality Date  . APPENDECTOMY    . STOMACH SURGERY  "middle school'   repair of "malrotation of stomach"       Family History  Problem Relation Age of Onset  . Diabetes Paternal Grandmother   . Hypertension Paternal Grandmother   . Stroke Paternal Grandmother   . Alcohol abuse Maternal Uncle     Social History   Tobacco Use  . Smoking status: Never Smoker  . Smokeless tobacco: Current User    Types: Chew  Substance Use Topics  . Alcohol use: Yes    Comment: occ  . Drug use: No    Home Medications Prior to Admission medications   Medication Sig Start Date End Date Taking? Authorizing Provider  doxycycline (VIBRAMYCIN) 100 MG capsule Take 1 capsule (100 mg total) by mouth 2 (two) times daily. 02/25/19   Tacy Learn, PA-C  doxycycline (VIBRAMYCIN) 100 MG capsule Take 1 capsule (100 mg total) by mouth 2 (two) times daily. 04/30/19   Fredia Sorrow, MD    Allergies    Isovue [iopamidol]  Review of Systems   Review of Systems  Constitutional:  Negative for chills and fever.  HENT: Negative for congestion, rhinorrhea and sore throat.   Eyes: Negative for visual disturbance.  Respiratory: Negative for cough and shortness of breath.   Cardiovascular: Negative for chest pain and leg swelling.  Gastrointestinal: Negative for abdominal pain, diarrhea, nausea and vomiting.  Genitourinary: Negative for dysuria.  Musculoskeletal: Negative for back pain and neck pain.  Skin: Negative for rash.  Neurological: Negative for dizziness, light-headedness and headaches.  Hematological: Does not bruise/bleed easily.  Psychiatric/Behavioral: Negative for confusion.    Physical Exam Updated Vital Signs Pulse 98   Temp 98.5 F (36.9 C)   Resp 13   Ht 1.854 m (6\' 1" )   Wt 90.7 kg   SpO2 94%   BMI 26.39 kg/m   Physical Exam Vitals and nursing note reviewed.  Constitutional:      Appearance: Normal appearance. He is well-developed.  HENT:     Head: Normocephalic and atraumatic.  Eyes:     Extraocular Movements: Extraocular movements intact.     Conjunctiva/sclera: Conjunctivae normal.     Pupils: Pupils are equal, round, and reactive to light.  Cardiovascular:     Rate and Rhythm: Normal rate and regular rhythm.     Heart sounds: No murmur.  Pulmonary:     Effort: Pulmonary effort is normal. No respiratory  distress.     Breath sounds: Normal breath sounds.  Abdominal:     Palpations: Abdomen is soft.     Tenderness: There is no abdominal tenderness.  Genitourinary:    Comments: Sacral area perianal area.  Are in the pilonidal location just to the right side of the midline patient with about 2 to 3 cm abscess with erythema measuring about 6 cm.  With some induration.  Not perianal and location.  Fluctuant. Musculoskeletal:        General: Normal range of motion.     Cervical back: Normal range of motion and neck supple.  Skin:    General: Skin is warm and dry.     Capillary Refill: Capillary refill takes less than 2 seconds.    Neurological:     General: No focal deficit present.     Mental Status: He is alert and oriented to person, place, and time.     ED Results / Procedures / Treatments   Labs (all labs ordered are listed, but only abnormal results are displayed) Labs Reviewed - No data to display  EKG None  Radiology No results found.  Procedures Procedures (including critical care time)  Medications Ordered in ED Medications  lidocaine-EPINEPHrine (XYLOCAINE W/EPI) 2 %-1:200000 (PF) injection 10 mL (10 mLs Infiltration Given by Other 04/30/19 1043)    ED Course  I have reviewed the triage vital signs and the nursing notes.  Pertinent labs & imaging results that were available during my care of the patient were reviewed by me and considered in my medical decision making (see chart for details).    MDM Rules/Calculators/A&P                      Findings consistent with the abscess sort of pilonidal location but may just be irregular skin abscess since patient's had so much trouble over the past year with multiple abscesses.  Never had anything exactly in that location in the past.  Not consistent with a perirectal or perianal abscess.  I&D by physician assistant.  We will put her on doxycycline.  Motrin for pain.  Patient will return to have the packing removed.    Final Clinical Impression(s) / ED Diagnoses Final diagnoses:  Pilonidal abscess    Rx / DC Orders ED Discharge Orders         Ordered    doxycycline (VIBRAMYCIN) 100 MG capsule  2 times daily,   Status:  Discontinued     04/30/19 1141    doxycycline (VIBRAMYCIN) 100 MG capsule  2 times daily     04/30/19 1143           Vanetta Mulders, MD 04/30/19 1147

## 2019-04-30 NOTE — ED Triage Notes (Signed)
Pt c/o MRSA for past several months, seen at [facility] facility for same. Pt now has a spot in buttock area Tuesday. Pt unable to sit down.

## 2019-04-30 NOTE — Discharge Instructions (Addendum)
Return for packing removal and 2 to 3 days.  Keep dressing in place if you can.  If you need to remove it just be careful not to remove the the packing ribbon.  Take the antibiotic doxycycline as directed.  Can take Motrin for pain.  Return for any new or worse symptoms.  Otherwise we will see you in 2 to 3 days to remove the packing.

## 2019-04-30 NOTE — ED Provider Notes (Signed)
..  Incision and Drainage  Date/Time: 04/30/2019 11:25 AM Performed by: Renita Papa, PA-C Authorized by: Renita Papa, PA-C   Consent:    Consent obtained:  Verbal   Consent given by:  Patient   Risks discussed:  Bleeding, incomplete drainage, pain and damage to other organs   Alternatives discussed:  No treatment Universal protocol:    Procedure explained and questions answered to patient or proxy's satisfaction: yes     Relevant documents present and verified: yes     Test results available and properly labeled: yes     Imaging studies available: yes     Required blood products, implants, devices, and special equipment available: yes     Site/side marked: yes     Immediately prior to procedure a time out was called: yes     Patient identity confirmed:  Verbally with patient Location:    Type:  Abscess   Size:  3x3cm   Location:  Anogenital   Anogenital location:  Gluteal cleft (R buttock) Pre-procedure details:    Skin preparation:  Betadine Anesthesia (see MAR for exact dosages):    Anesthesia method:  Local infiltration   Local anesthetic:  Lidocaine 2% WITH epi Procedure type:    Complexity:  Complex Procedure details:    Incision types:  Single straight   Incision depth:  Subcutaneous   Scalpel blade:  11   Wound management:  Probed and deloculated, irrigated with saline and extensive cleaning   Drainage:  Purulent and bloody   Drainage amount:  Copious   Wound treatment:  Drain placed   Packing materials:  1/4 in gauze Post-procedure details:    Patient tolerance of procedure:  Tolerated well, no immediate complications      Renita Papa, PA-C 04/30/19 1125    Fredia Sorrow, MD 04/30/19 [phone]

## 2019-07-03 ENCOUNTER — Other Ambulatory Visit: Payer: Self-pay

## 2019-07-03 ENCOUNTER — Encounter (HOSPITAL_BASED_OUTPATIENT_CLINIC_OR_DEPARTMENT_OTHER): Payer: Self-pay | Admitting: Student

## 2019-07-03 ENCOUNTER — Emergency Department (HOSPITAL_BASED_OUTPATIENT_CLINIC_OR_DEPARTMENT_OTHER)
Admission: EM | Admit: 2019-07-03 | Discharge: 2019-07-03 | Disposition: A | Discharge status: Home / Self Care | Payer: Self-pay | Attending: Emergency Medicine | Admitting: Emergency Medicine

## 2019-07-03 DIAGNOSIS — Z91041 Radiographic dye allergy status: Secondary | ICD-10-CM | POA: Insufficient documentation

## 2019-07-03 DIAGNOSIS — L0231 Cutaneous abscess of buttock: Secondary | ICD-10-CM | POA: Insufficient documentation

## 2019-07-03 MED ORDER — DOXYCYCLINE HYCLATE 100 MG PO CAPS: 100.0000 mg | ORAL_CAPSULE | Freq: Two times a day (BID) | ORAL | 0 refills | Status: AC

## 2019-07-03 MED ORDER — NAPROXEN 500 MG PO TABS: 500.0000 mg | ORAL_TABLET | Freq: Two times a day (BID) | ORAL | 0 refills | Status: AC

## 2019-07-03 MED ORDER — LIDOCAINE HCL (PF) 1 % IJ SOLN
10.0000 mL | Freq: Once | INTRAMUSCULAR | Status: AC
  Administered 2019-07-03: 10 mL
  Filled 2019-07-03: qty 10

## 2019-07-03 NOTE — Discharge Instructions (Addendum)
You were seen in the emergency department for a skin abscess- please see the attached handout for further information regarding this diagnoses. This area was incised and drained to help release the bacteria. We would like you to apply warm compresses and warm flushes to this area 4-5 times per day to help facilitate further draining as needed. We are also starting you on doxycycline, an antibiotic, in order to help treat the infection. We have also prescribed you naproxen, Naproxen is a nonsteroidal anti-inflammatory medication that will help with pain and swelling. Be sure to take this medication as prescribed with food, 1 pill every 12 hours,  It should be taken with food, as it can cause stomach upset, and more seriously, stomach bleeding. Do not take other nonsteroidal anti-inflammatory medications with this such as Advil, Motrin, Aleve, Mobic, Goodie Powder, or Motrin.     You make take Tylenol per over the counter dosing with these medications.   We have prescribed you new medication(s) today. Discuss the medications prescribed today with your pharmacist as they can have adverse effects and interactions with your other medicines including over the counter and prescribed medications. Seek medical evaluation if you start to experience new or abnormal symptoms after taking one of these medicines, seek care immediately if you start to experience difficulty breathing, feeling of your throat closing, facial swelling, or rash as these could be indications of a more serious allergic reaction  We would like you to follow up with your primary care provider to have this area rechecked within 48 hours. Return to the ER sooner for new or worsening symptoms including, but not limited to increased pain, spreading redness, fevers, inability to keep fluids down, scrotal pain, pain with bowel movements, or any other concerns that you may have.

## 2019-07-03 NOTE — ED Provider Notes (Signed)
[facility] EMERGENCY DEPARTMENT Provider Note   CSN: [id number] Arrival date & time: 07/03/19  1248     History Chief Complaint  Patient presents with  . Abscess    Arthur Mills is a 29 y.o. male with a history of several prior abscesses, MRSA, & migraines who presents to the emergency department with complaints of left buttocks abscess which has been progressively worsening over the past 3 to 4 days.  Patient states the area is painful, worse with standing/movement, no alleviating factors.  Reports having some mild drainage from the area.  States this is similar to prior areas requiring I&D and antibiotics.  Denies fever, chills, nausea, vomiting, abdominal pain, pelvic pain, testicular pain/swelling, or pain with bowel movements.  HPI     Past Medical History:  Diagnosis Date  . Frequent headaches   . History of chicken pox   . History of kidney stones 2012  . Migraine     Patient Active Problem List   Diagnosis Date Noted  . MRSA cellulitis/Scrotal infection 12/28/2018  . Scrotal abscess 12/27/2018  . Chronic back pain 07/03/2015  . Tachycardia 07/03/2015  . Chronic migraine without aura without status migrainosus, not intractable 07/03/2015    Past Surgical History:  Procedure Laterality Date  . APPENDECTOMY    . STOMACH SURGERY  "middle school'   repair of "malrotation of stomach"       Family History  Problem Relation Age of Onset  . Diabetes Paternal Grandmother   . Hypertension Paternal Grandmother   . Stroke Paternal Grandmother   . Alcohol abuse Maternal Uncle     Social History   Tobacco Use  . Smoking status: Never Smoker  . Smokeless tobacco: Current User    Types: Chew  Substance Use Topics  . Alcohol use: Yes    Comment: occ  . Drug use: No    Home Medications Prior to Admission medications   Medication Sig Start Date End Date Taking? Authorizing Provider  doxycycline (VIBRAMYCIN) 100 MG capsule Take 1 capsule (100 mg  total) by mouth 2 (two) times daily. 02/25/19   Jeannie Fend, PA-C  doxycycline (VIBRAMYCIN) 100 MG capsule Take 1 capsule (100 mg total) by mouth 2 (two) times daily. 04/30/19   Vanetta Mulders, MD    Allergies    Isovue [iopamidol]  Review of Systems   Review of Systems  Constitutional: Negative for chills and fever.  Respiratory: Negative for shortness of breath.   Cardiovascular: Negative for chest pain.  Gastrointestinal: Negative for abdominal pain, nausea, rectal pain and vomiting.  Genitourinary: Negative for penile pain, penile swelling, scrotal swelling and testicular pain.  Skin: Positive for wound.  All other systems reviewed and are negative.   Physical Exam Updated Vital Signs BP 140/87 (BP Location: Left Arm)   Pulse (!) 104   Temp 98.5 F (36.9 C) (Oral)   Resp 18   Ht 6\' 1"  (1.854 m)   Wt 90.3 kg   SpO2 99%   BMI 26.25 kg/m   Physical Exam Vitals and nursing note reviewed. Exam conducted with a chaperone present.  Constitutional:      General: He is not in acute distress.    Appearance: He is well-developed. He is not toxic-appearing.  HENT:     Head: Normocephalic and atraumatic.  Eyes:     General:        Right eye: No discharge.        Left eye: No discharge.  Conjunctiva/sclera: Conjunctivae normal.  Cardiovascular:     Rate and Rhythm: Normal rate and regular rhythm.  Pulmonary:     Effort: Pulmonary effort is normal. No respiratory distress.     Breath sounds: Normal breath sounds. No wheezing, rhonchi or rales.  Abdominal:     General: There is no distension.     Palpations: Abdomen is soft.     Tenderness: There is no abdominal tenderness.  Genitourinary:    Testes:        Right: Tenderness or swelling not present.        Left: Tenderness or swelling not present.     Epididymis:     Right: No tenderness.     Left: No tenderness.       Comments: DRE without palpable fluctuance, patient does not report any pain with this.    Musculoskeletal:     Cervical back: Neck supple.  Skin:    General: Skin is warm and dry.     Findings: No rash.  Neurological:     Mental Status: He is alert.     Comments: Clear speech.   Psychiatric:        Behavior: Behavior normal.     ED Results / Procedures / Treatments   Labs (all labs ordered are listed, but only abnormal results are displayed) Labs Reviewed - No data to display  EKG None  Radiology No results found.  Procedures .Marland KitchenIncision and Drainage  Date/Time: 07/03/2019 1:39 PM Performed by: Amaryllis Dyke, PA-C Authorized by: Amaryllis Dyke, PA-C   Consent:    Consent obtained:  Verbal   Consent given by:  Patient   Risks discussed:  Bleeding, damage to other organs, infection, incomplete drainage and pain   Alternatives discussed:  No treatment and alternative treatment Location:    Type:  Abscess   Size:  3   Location:  Lower extremity   Lower extremity location:  Buttock   Buttock location:  L buttock Pre-procedure details:    Skin preparation:  Betadine Anesthesia (see MAR for exact dosages):    Anesthesia method:  Local infiltration   Local anesthetic:  Lidocaine 1% w/o epi Procedure type:    Complexity:  Simple Procedure details:    Incision types:  Stab incision   Scalpel blade:  11   Wound management:  Probed and deloculated and irrigated with saline   Drainage:  Bloody and purulent   Drainage amount:  Moderate   Wound treatment:  Wound left open   Packing materials:  None Post-procedure details:    Patient tolerance of procedure:  Tolerated well, no immediate complications   (including critical care time)  Medications Ordered in ED Medications  lidocaine (PF) (XYLOCAINE) 1 % injection 10 mL (10 mLs Infiltration Given by Other 07/03/19 1313)    ED Course  I have reviewed the triage vital signs and the nursing notes.  Pertinent labs & imaging results that were available during my care of the patient were  reviewed by me and considered in my medical decision making (see chart for details).    MDM Rules/Calculators/A&P                      Patient presents to the ED with left buttocks abscess amenable to I&D based on exam. Does not extend to anus, no fluctuance/pain with DRE- do not suspect perirectal abscess. Procedure per note above. Given mild surrounding cellulitis will start patient on Doxycycline. Recommended application of warm compresses/soaks/flushing.  Will have patient follow up with PCP for wound recheck in 2 days. I discussed treatment plan, need for follow-up, and return precautions with the patient. Provided opportunity for questions, patient confirmed understanding and is in agreement with plan.   Final Clinical Impression(s) / ED Diagnoses Final diagnoses:  Left buttock abscess    Rx / DC Orders ED Discharge Orders         Ordered    doxycycline (VIBRAMYCIN) 100 MG capsule  2 times daily     07/03/19 1343    naproxen (NAPROSYN) 500 MG tablet  2 times daily     07/03/19 1343           Walther Sanagustin, [place] R, PA-C 07/03/19 1343    Virgina Norfolk, DO 07/03/19 1350

## 2019-07-03 NOTE — ED Triage Notes (Signed)
Pt c/o abscess left buttock x 4 days-NAD-steady gait

## 2019-07-03 NOTE — ED Notes (Signed)
ED Provider at bedside. 

## 2020-11-15 ENCOUNTER — Other Ambulatory Visit: Payer: Self-pay

## 2020-11-15 ENCOUNTER — Emergency Department (HOSPITAL_BASED_OUTPATIENT_CLINIC_OR_DEPARTMENT_OTHER): Payer: Self-pay

## 2020-11-15 ENCOUNTER — Encounter (HOSPITAL_BASED_OUTPATIENT_CLINIC_OR_DEPARTMENT_OTHER): Payer: Self-pay

## 2020-11-15 ENCOUNTER — Emergency Department (HOSPITAL_BASED_OUTPATIENT_CLINIC_OR_DEPARTMENT_OTHER)
Admission: EM | Admit: 2020-11-15 | Discharge: 2020-11-15 | Disposition: A | Discharge status: Home / Self Care | Payer: Self-pay | Attending: Emergency Medicine | Admitting: Emergency Medicine

## 2020-11-15 DIAGNOSIS — D72829 Elevated white blood cell count, unspecified: Secondary | ICD-10-CM | POA: Insufficient documentation

## 2020-11-15 DIAGNOSIS — F17220 Nicotine dependence, chewing tobacco, uncomplicated: Secondary | ICD-10-CM | POA: Insufficient documentation

## 2020-11-15 DIAGNOSIS — L03115 Cellulitis of right lower limb: Secondary | ICD-10-CM | POA: Insufficient documentation

## 2020-11-15 LAB — COMPREHENSIVE METABOLIC PANEL
ALT: 40 U/L (ref 0–44)
AST: 41 U/L (ref 15–41)
Albumin: 4.2 g/dL (ref 3.5–5.0)
Alkaline Phosphatase: 58 U/L (ref 38–126)
Anion gap: 7 (ref 5–15)
BUN: 10 mg/dL (ref 6–20)
CO2: 26 mmol/L (ref 22–32)
Calcium: 9.2 mg/dL (ref 8.9–10.3)
Chloride: 102 mmol/L (ref 98–111)
Creatinine, Ser: 0.94 mg/dL (ref 0.61–1.24)
GFR, Estimated: >60 mL/min (ref >60)
Glucose, Bld: 134 mg/dL — ABNORMAL HIGH (ref 70–99)
Potassium: 3.8 mmol/L (ref 3.5–5.1)
Sodium: 135 mmol/L (ref 135–145)
Total Bilirubin: 0.4 mg/dL (ref 0.3–1.2)
Total Protein: 8 g/dL (ref 6.5–8.1)

## 2020-11-15 LAB — CBC
HCT: 40.6 % (ref 39.0–52.0)
Hemoglobin: 13.7 g/dL (ref 13.0–17.0)
MCH: 29.4 pg (ref 26.0–34.0)
MCHC: 33.7 g/dL (ref 30.0–36.0)
MCV: 87.1 fL (ref 80.0–100.0)
Platelets: 319 10*3/uL (ref 150–400)
RBC: 4.66 MIL/uL (ref 4.22–5.81)
RDW: 12 % (ref 11.5–15.5)
WBC: 11 10*3/uL — ABNORMAL HIGH (ref 4.0–10.5)
nRBC: 0 % (ref 0.0–0.2)

## 2020-11-15 LAB — LACTIC ACID, PLASMA: Lactic Acid, Venous: 1.1 mmol/L (ref 0.5–1.9)

## 2020-11-15 MED ORDER — DOXYCYCLINE HYCLATE 100 MG PO CAPS: 100.0000 mg | ORAL_CAPSULE | Freq: Two times a day (BID) | ORAL | 0 refills | Status: AC

## 2020-11-15 NOTE — ED Provider Notes (Signed)
[facility] EMERGENCY DEPARTMENT Provider Note   CSN: [id number] Arrival date & time: 11/15/20  1550     History Chief Complaint  Patient presents with   Cellulitis    Arthur Mills is a 30 y.o. male who presents to the ED today with complaint of gradual onset, constant, achy/throbbing, R ankle pain that began 3 days ago.  Patient states that he had rode his motorcycle to [place] earlier last week, took him approximately 19 hours.  He states that while there he was walking in a field with his regular shoes on and long tall socks.  He states that at that time he began having a mild aching in his ankle.  He went home that night and did not notice any redness or swelling.  He states he thought maybe he saw what looked like a bug bite however did not feel anything bite him.  He states that the next morning he woke up with significant swelling and redness as well as pain to the right ankle.  He states he has pain with ambulating at this time.  He does have a small abrasion to his right anterior distal shin however states that this occurred after he noticed the swelling.  He denies any fevers.  He states he may be feels chilly however is unsure.  He does have history of cellulitis in the past however states this feels completely different.  He denies any chest pain or shortness of breath.  No history of DVT or PE.  No trauma to the ankle.   The history is provided by the patient and medical records.      Past Medical History:  Diagnosis Date   Frequent headaches    History of chicken pox    History of kidney stones 2012   Migraine     Patient Active Problem List   Diagnosis Date Noted   MRSA cellulitis/Scrotal infection 12/28/2018   Scrotal abscess 12/27/2018   Chronic back pain 07/03/2015   Tachycardia 07/03/2015   Chronic migraine without aura without status migrainosus, not intractable 07/03/2015    Past Surgical History:  Procedure Laterality Date   APPENDECTOMY      STOMACH SURGERY  "middle school'   repair of "malrotation of stomach"       Family History  Problem Relation Age of Onset   Diabetes Paternal Grandmother    Hypertension Paternal Grandmother    Stroke Paternal Grandmother    Alcohol abuse Maternal Uncle     Social History   Tobacco Use   Smoking status: Never   Smokeless tobacco: Current    Types: Chew  Vaping Use   Vaping Use: Never used  Substance Use Topics   Alcohol use: Yes    Comment: occ   Drug use: No    Home Medications Prior to Admission medications   Medication Sig Start Date End Date Taking? Authorizing Provider  doxycycline (VIBRAMYCIN) 100 MG capsule Take 1 capsule (100 mg total) by mouth 2 (two) times daily. 11/15/20  Yes Saphire Barnhart, PA-C  doxycycline (VIBRAMYCIN) 100 MG capsule Take 1 capsule (100 mg total) by mouth 2 (two) times daily. 07/03/19   Petrucelli, Samantha R, PA-C  naproxen (NAPROSYN) 500 MG tablet Take 1 tablet (500 mg total) by mouth 2 (two) times daily. 07/03/19   Petrucelli, Samantha R, PA-C    Allergies    Isovue [iopamidol]  Review of Systems   Review of Systems  Constitutional:  Positive for chills. Negative for fever.  Gastrointestinal:  Negative for nausea and vomiting.  Musculoskeletal:  Positive for arthralgias and joint swelling.  Skin:  Positive for color change. Negative for wound.  All other systems reviewed and are negative.  Physical Exam Updated Vital Signs BP 118/78   Pulse 79   Temp 98.5 F (36.9 C) (Oral)   Resp 16   Ht 5\' 11"  (1.803 m)   Wt 90.7 kg   SpO2 100%   BMI 27.89 kg/m   Physical Exam Vitals and nursing note reviewed.  Constitutional:      Appearance: He is not ill-appearing or diaphoretic.  HENT:     Head: Normocephalic and atraumatic.  Eyes:     Conjunctiva/sclera: Conjunctivae normal.  Cardiovascular:     Rate and Rhythm: Normal rate and regular rhythm.     Pulses: Normal pulses.  Pulmonary:     Effort: Pulmonary effort is normal.      Breath sounds: Normal breath sounds. No wheezing, rhonchi or rales.  Abdominal:     Palpations: Abdomen is soft.     Tenderness: There is no abdominal tenderness.  Musculoskeletal:     Cervical back: Neck supple.     Comments: Moderate swelling noted to R foot, ankle, and distal lower leg with erythema and increased warmth to the touch. Erythema does slightly extend into the distal lower leg. No signs of insect bite. ROM limited s/2 pain. 2+ DP pulse.   Skin:    General: Skin is warm and dry.  Neurological:     Mental Status: He is alert.    ED Results / Procedures / Treatments   Labs (all labs ordered are listed, but only abnormal results are displayed) Labs Reviewed  CBC - Abnormal; Notable for the following components:      Result Value   WBC 11.0 (*)    All other components within normal limits  COMPREHENSIVE METABOLIC PANEL - Abnormal; Notable for the following components:   Glucose, Bld 134 (*)    All other components within normal limits  CULTURE, BLOOD (ROUTINE X 2)  CULTURE, BLOOD (ROUTINE X 2)  LACTIC ACID, PLASMA    EKG None  Radiology DG Ankle Complete Right  Result Date: 11/15/2020 CLINICAL DATA:  Redness and swelling about the right ankle and foot. No known injury. EXAM: RIGHT ANKLE - COMPLETE 3+ VIEW COMPARISON:  None. FINDINGS: There is no evidence of fracture, dislocation, or joint effusion. There is no evidence of arthropathy or other focal bone abnormality. Soft tissues are swollen. No soft tissue gas or radiopaque foreign body. IMPRESSION: Soft tissue swelling.  Otherwise negative. Electronically Signed   By: 01/16/2021 M.D.   On: 11/15/2020 18:40   01/16/2021 Venous Img Lower Right (DVT Study)  Result Date: 11/15/2020 CLINICAL DATA:  Swelling. EXAM: RIGHT LOWER EXTREMITY VENOUS DOPPLER ULTRASOUND TECHNIQUE: Gray-scale sonography with compression, as well as color and duplex ultrasound, were performed to evaluate the deep venous system(s) from the level  of the common femoral vein through the popliteal and proximal calf veins. COMPARISON:  None. FINDINGS: VENOUS Normal compressibility of the common femoral, superficial femoral, and popliteal veins, as well as the visualized calf veins. Visualized portions of profunda femoral vein and great saphenous vein unremarkable. No filling defects to suggest DVT on grayscale or color Doppler imaging. Doppler waveforms show normal direction of venous flow, normal respiratory plasticity and response to augmentation. Limited views of the contralateral common femoral vein are unremarkable. OTHER Subcutaneous right ankle edema. IMPRESSION: 1. No evidence of DVT.  2. Right ankle edema. Electronically Signed   By: Feliberto HartsFrederick S Deloria MD   On: 11/15/2020 18:18   DG Foot Complete Right  Result Date: 11/15/2020 CLINICAL DATA:  Redness and swelling about the right ankle and foot. No known injury. EXAM: RIGHT FOOT COMPLETE - 3+ VIEW COMPARISON:  None. FINDINGS: There is no evidence of fracture or dislocation. There is no evidence of arthropathy or other focal bone abnormality. Soft tissues are swollen. No soft tissue gas or radiopaque foreign body. IMPRESSION: Soft tissue swelling.  The exam is otherwise negative. Electronically Signed   By: Drusilla Kannerhomas  Dalessio M.D.   On: 11/15/2020 18:40    Procedures Procedures   Medications Ordered in ED Medications - No data to display  ED Course  I have reviewed the triage vital signs and the nursing notes.  Pertinent labs & imaging results that were available during my care of the patient were reviewed by me and considered in my medical decision making (see chart for details).    MDM Rules/Calculators/A&P                          30 year old male who presents to the ED today with complaint of atraumatic right ankle pain with swelling, redness, increased warmth for the past 3 days after walking in a field in [place].  He rode his motorcycle up to [place], total of 19 hours.   On arrival to the ED today she was afebrile, mildly tachycardic at 111 however this is decreased while in the room, nontachypneic, nontoxic-appearing.  His work-up was started in the waiting room including CBC, CMP, lactic acid with concern for possible cellulitis.  Patient does report history of cellulitis and states this feels completely different.  CBC does have a mild elevation in leukocytosis of 11,000, hemoglobin stable at 13.7.  CMP with glucose 134, no other electrolyte abnormalities.  Lactic acid within normal limits at 1.1.  On my exam he has significant swelling and erythema to the right foot and right ankle extending into the distal right lower leg.  He has no calf tenderness palpation in his calf does not appear more swollen compared to the left side however given recent travel question DVT.  We will also plan for x-ray at this time of the ankle itself.  If no acute findings will plan to treat for cellulitis.  DVT study negative Xray negative for acute findings  Will discharge home at this time with abx for cellulitis and PCP follow up. Pt in agreement with plan and stable for discharge home.   This note was prepared using Dragon voice recognition software and may include unintentional dictation errors due to the inherent limitations of voice recognition software.   Final Clinical Impression(s) / ED Diagnoses Final diagnoses:  Cellulitis of right lower extremity    Rx / DC Orders ED Discharge Orders          Ordered    doxycycline (VIBRAMYCIN) 100 MG capsule  2 times daily        11/15/20 1847             Discharge Instructions      Your workup was overall reassuring at this time. Please pick up your prescription and take as prescribed to cover for infection.   Follow up with your PCP regarding ED visit and for recheck of your symptoms  Return to the ED for any new/worsening symptoms including no improvement/worsening redness after 48 hours on antibiotics,  red  streaking up your leg, persistent fever > 100.4, or any other new/worsening symptoms       Tanda Rockers, PA-C 11/15/20 1851    Terald Sleeper, MD 11/16/20 1017

## 2020-11-15 NOTE — ED Notes (Signed)
Blood cultures drawn but held for now until MD sees patient.

## 2020-11-15 NOTE — ED Triage Notes (Signed)
Reports possible bite to right lateral ankle.  Swelling redness and heat noted to right ankle/foot.

## 2020-11-15 NOTE — Discharge Instructions (Addendum)
Your workup was overall reassuring at this time. Please pick up your prescription and take as prescribed to cover for infection.   Follow up with your PCP regarding ED visit and for recheck of your symptoms  Return to the ED for any new/worsening symptoms including no improvement/worsening redness after 48 hours on antibiotics, red streaking up your leg, persistent fever > 100.4, or any other new/worsening symptoms

## 2020-11-20 LAB — CULTURE, BLOOD (ROUTINE X 2)
Culture: NO GROWTH
Culture: NO GROWTH
Special Requests: ADEQUATE

## 2022-09-13 IMAGING — US US EXTREM LOW VENOUS*R*
1 series · 14 of 24 positions shown · non-contrast
Comparison: None.

CLINICAL DATA: Swelling.

EXAM:
RIGHT LOWER EXTREMITY VENOUS DOPPLER ULTRASOUND
TECHNIQUE: Gray-scale sonography with compression, as well as color and duplex
ultrasound, were performed to evaluate the deep venous system(s)
from the level of the common femoral vein through the popliteal and
proximal calf veins.

[Series 1: us extrem low venous*right* · 37 acquisitions, 14 frames shown]
[im 1/37]
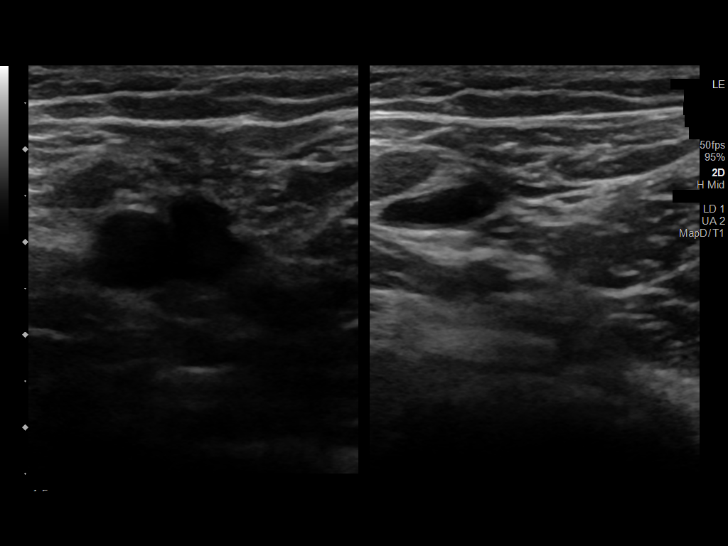
[im 4/37]
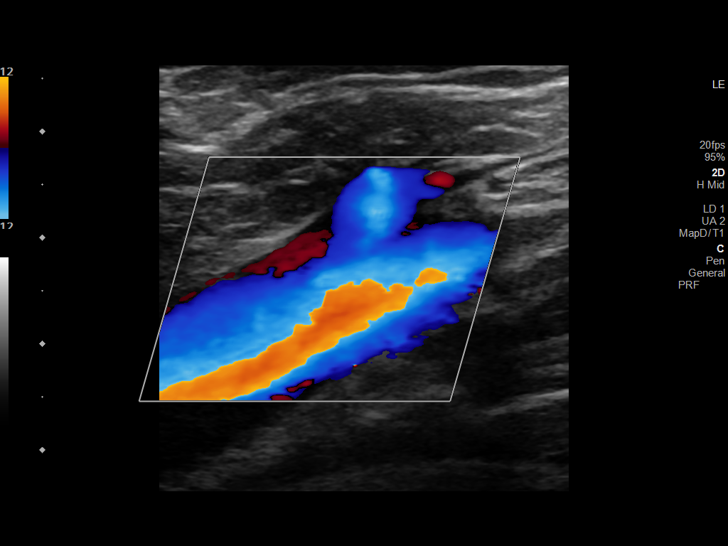
[im 7/37]
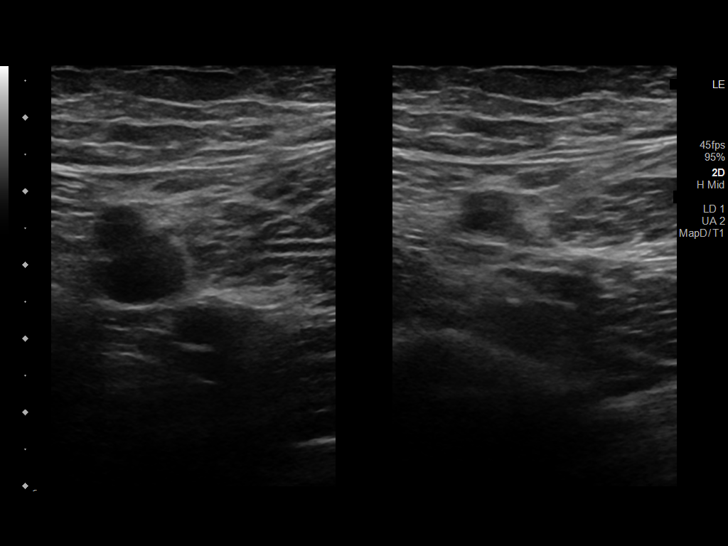
[im 10/37]
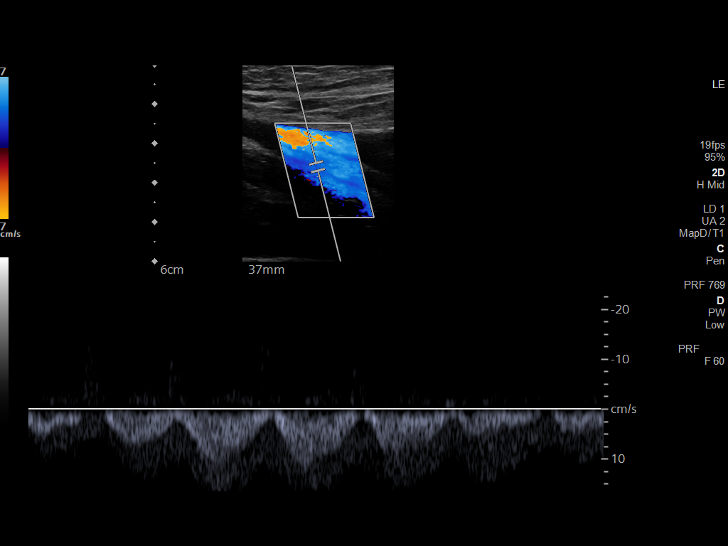
[im 11/37]
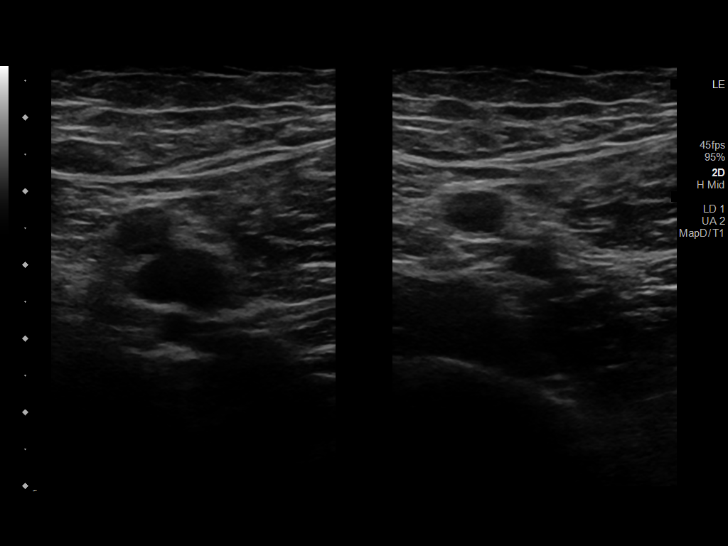
[im 15/37]
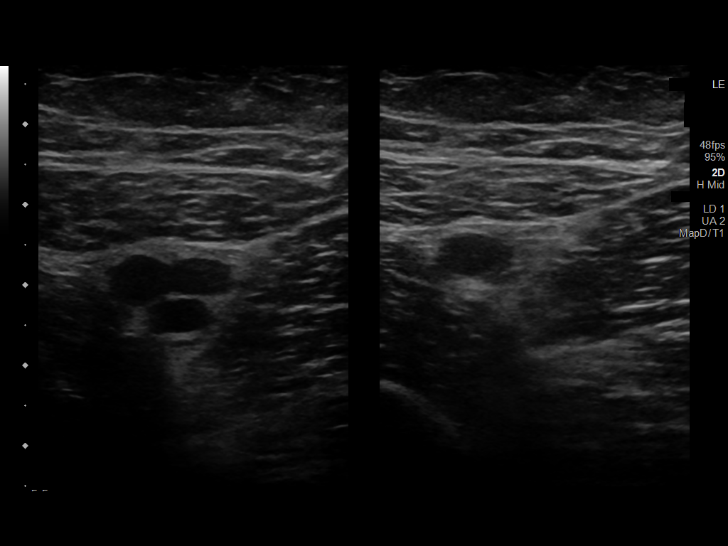
[im 18/37]
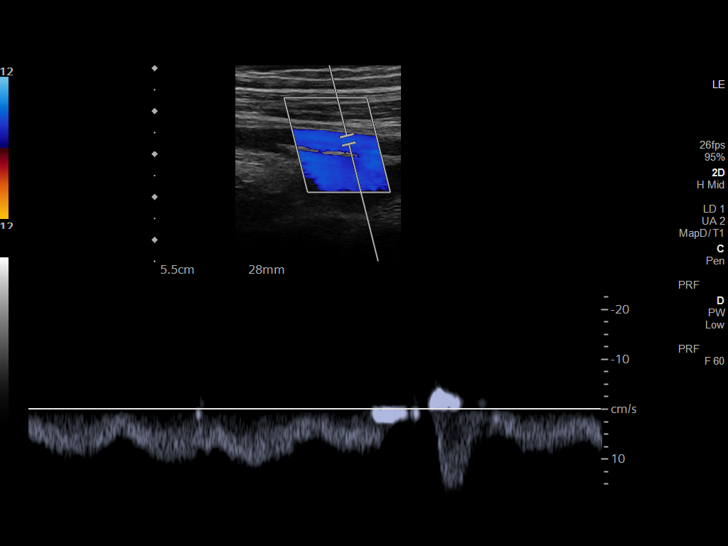
[im 21/37]
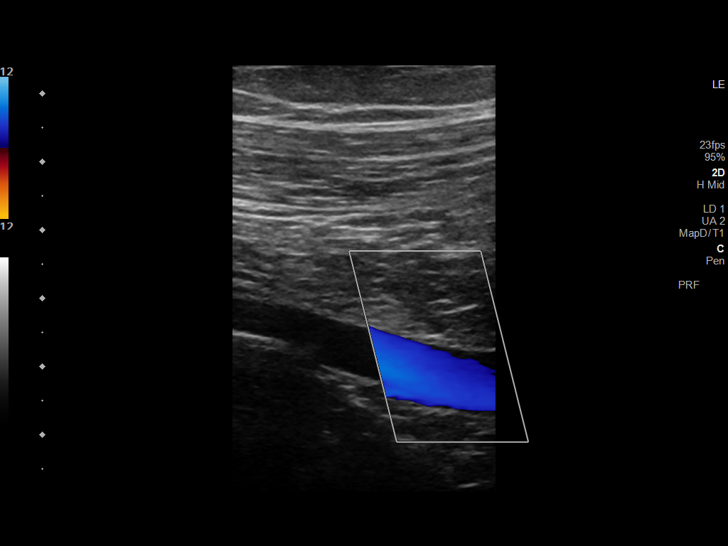
[im 24/37]
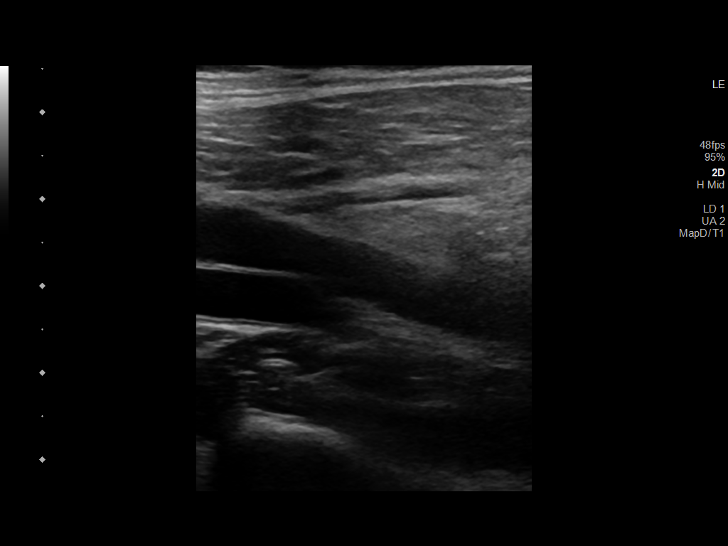
[im 27/37]
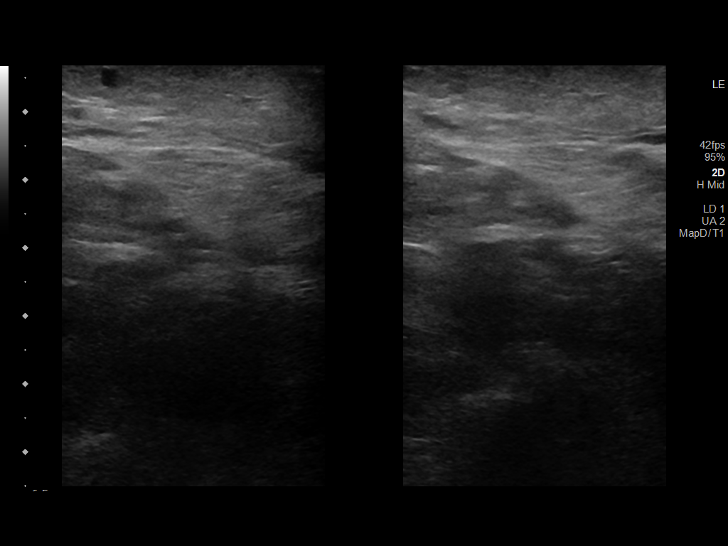
[im 30/37]
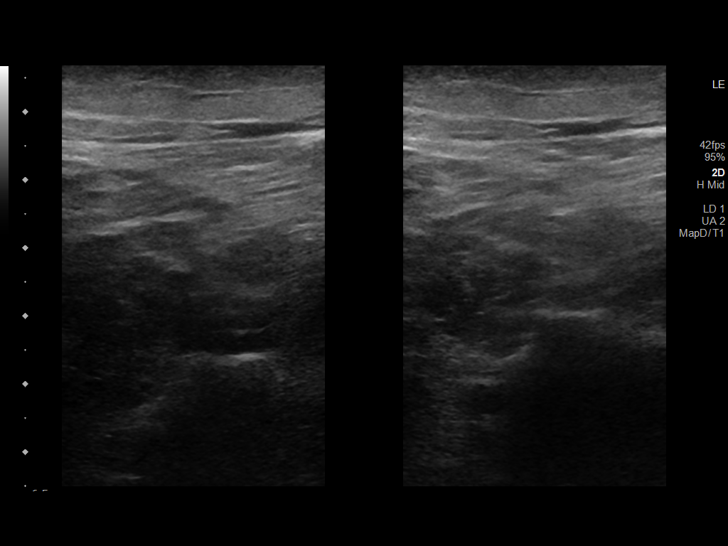
[im 32/37]
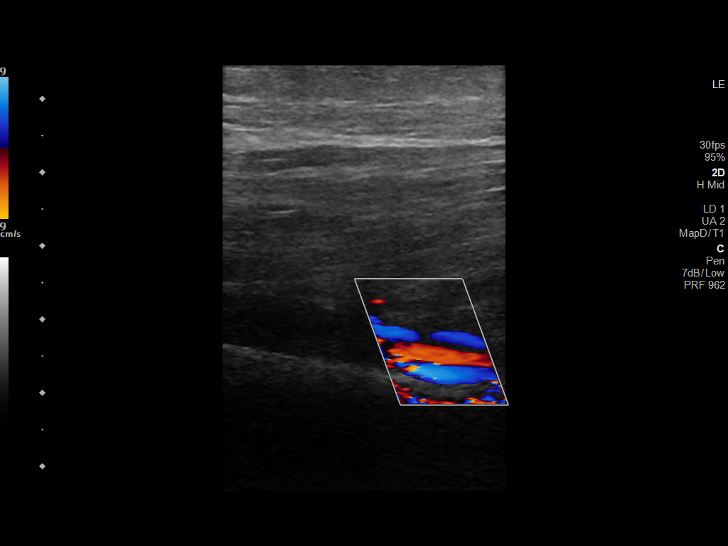
[im 33/37]
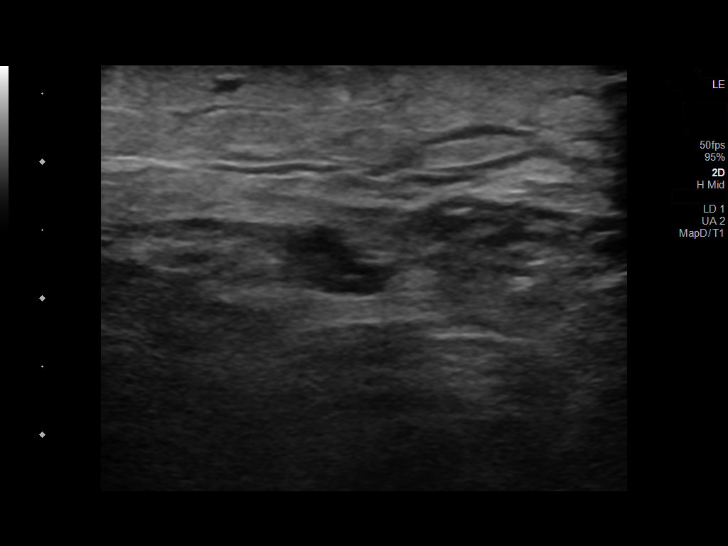
[im 37/37]
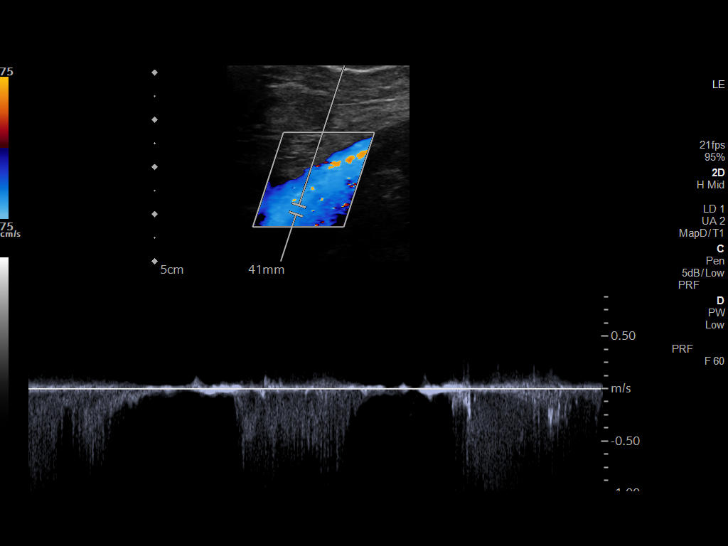

[14 of 24 positions shown; findings below may reference images not displayed]

FINDINGS: VENOUS

Normal compressibility of the common femoral, superficial femoral,
and popliteal veins, as well as the visualized calf veins.
Visualized portions of profunda femoral vein and great saphenous
vein unremarkable. No filling defects to suggest DVT on grayscale or
color Doppler imaging. Doppler waveforms show normal direction of
venous flow, normal respiratory plasticity and response to
augmentation.

Limited views of the contralateral common femoral vein are
unremarkable.

OTHER

Subcutaneous right ankle edema.
IMPRESSION: 1. No evidence of DVT.
2. Right ankle edema.

## 2022-09-13 IMAGING — DX DG ANKLE COMPLETE 3+V*R*
3 series · 3 of 3 positions shown · non-contrast
Comparison: None.

CLINICAL DATA: Redness and swelling about the right ankle and foot.
No known injury.

EXAM:
RIGHT ANKLE - COMPLETE 3+ VIEW

[ankle ap]
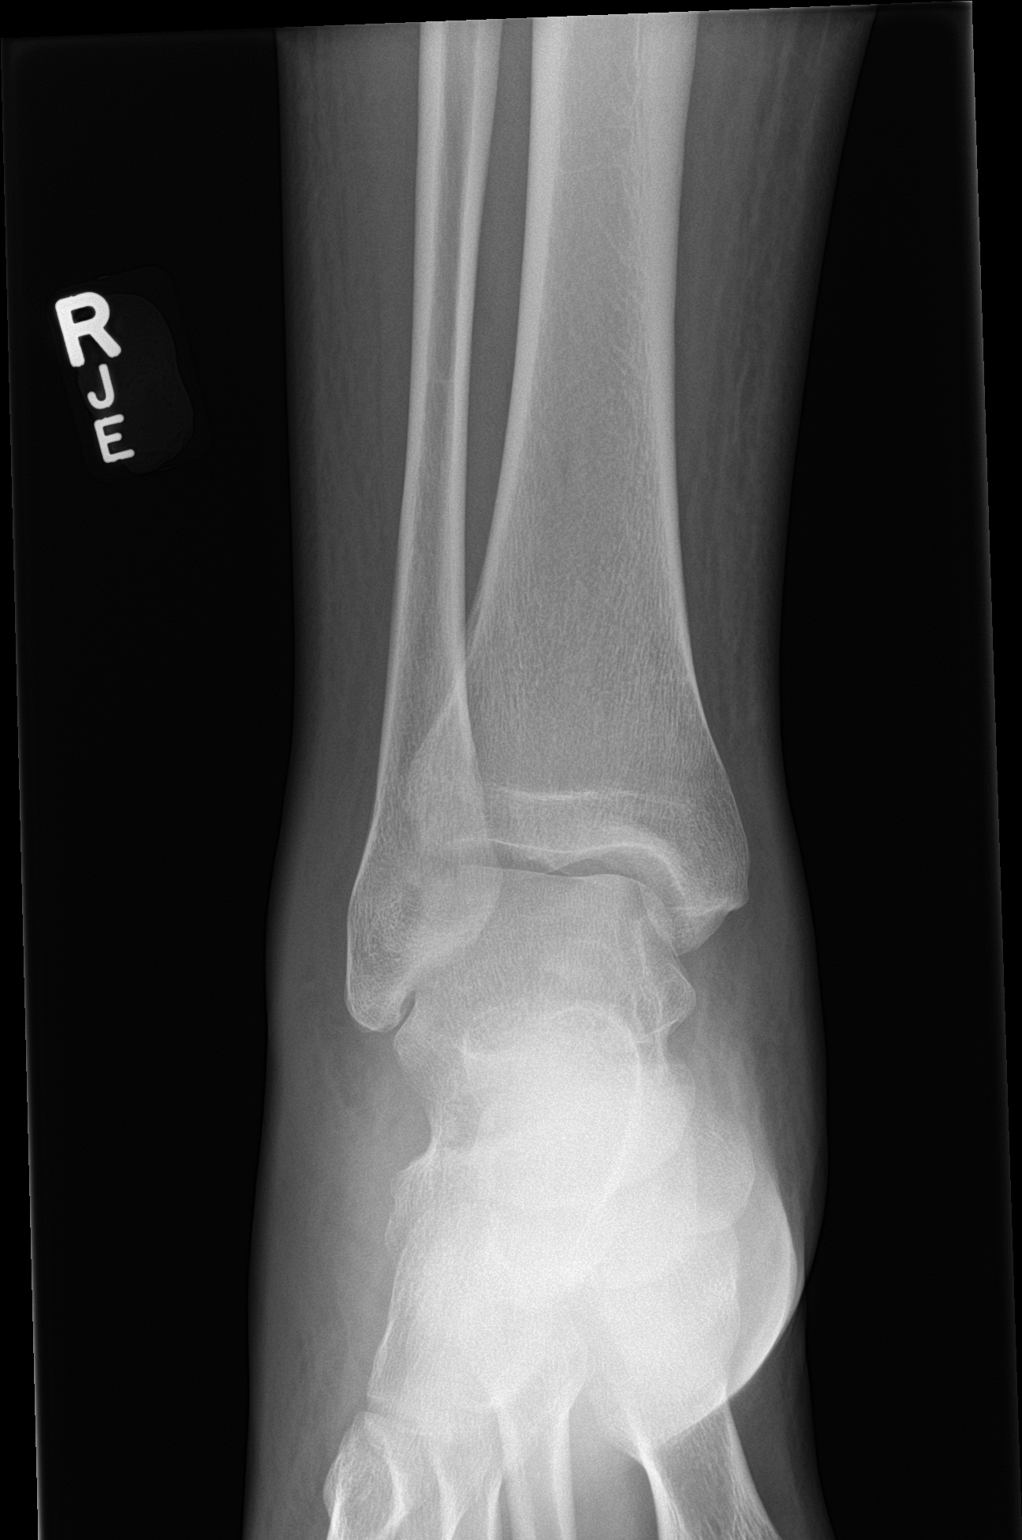

[ankle obl]
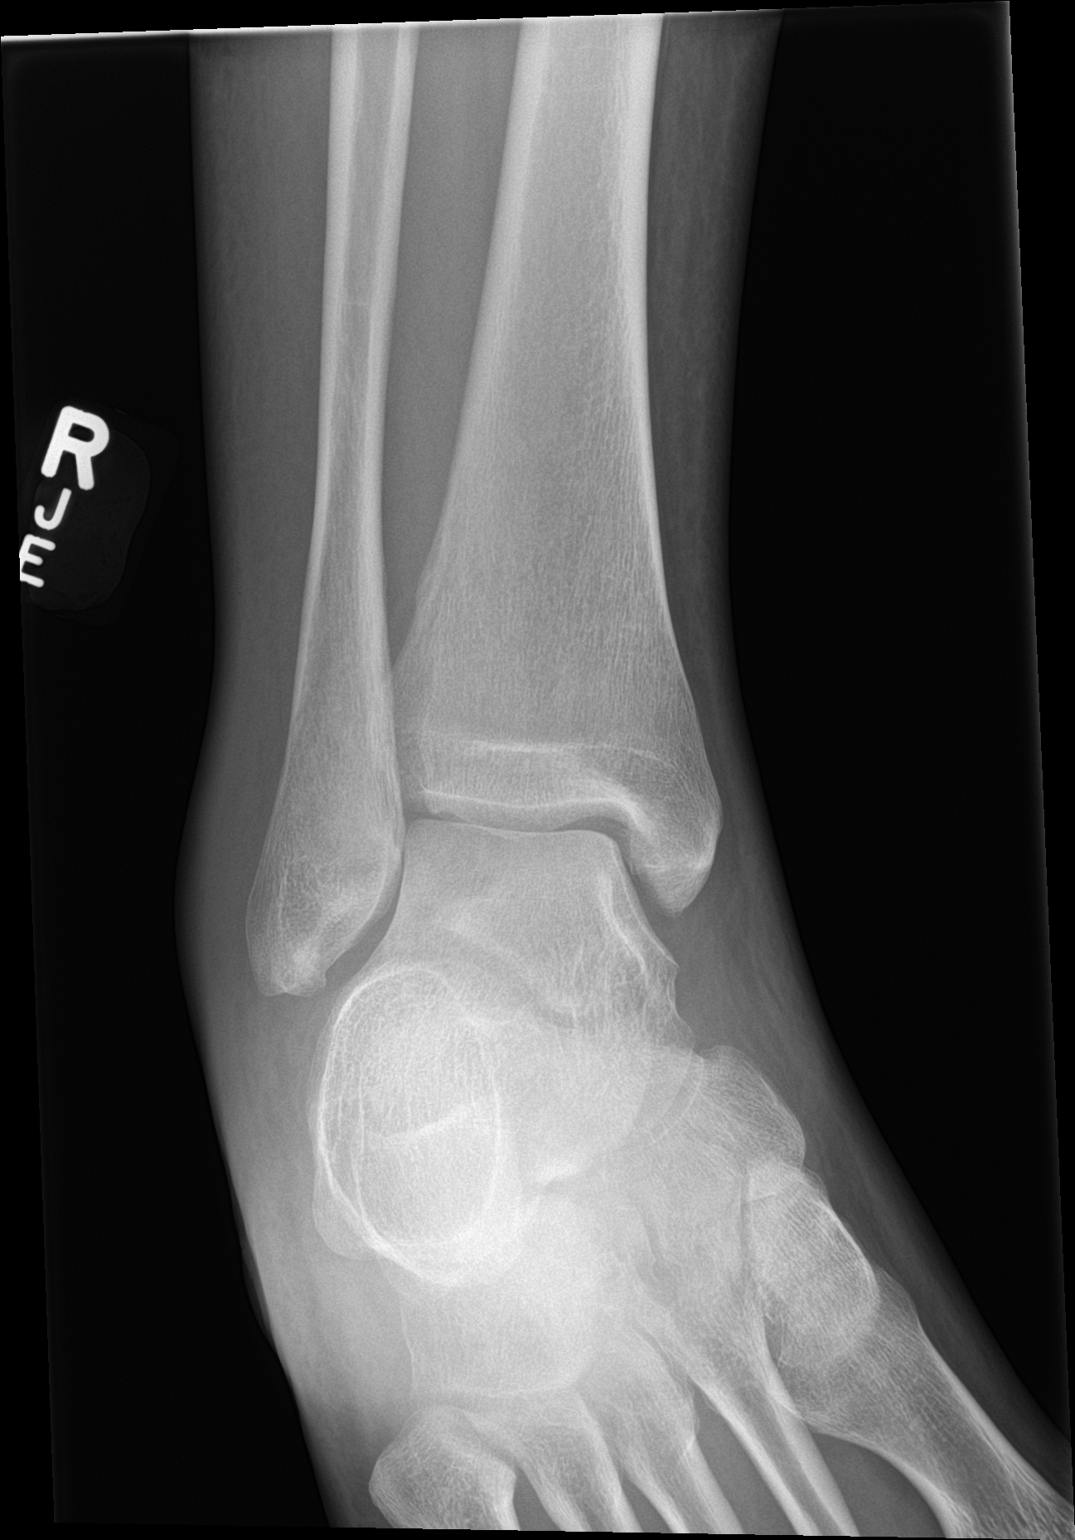

[ankle lat]
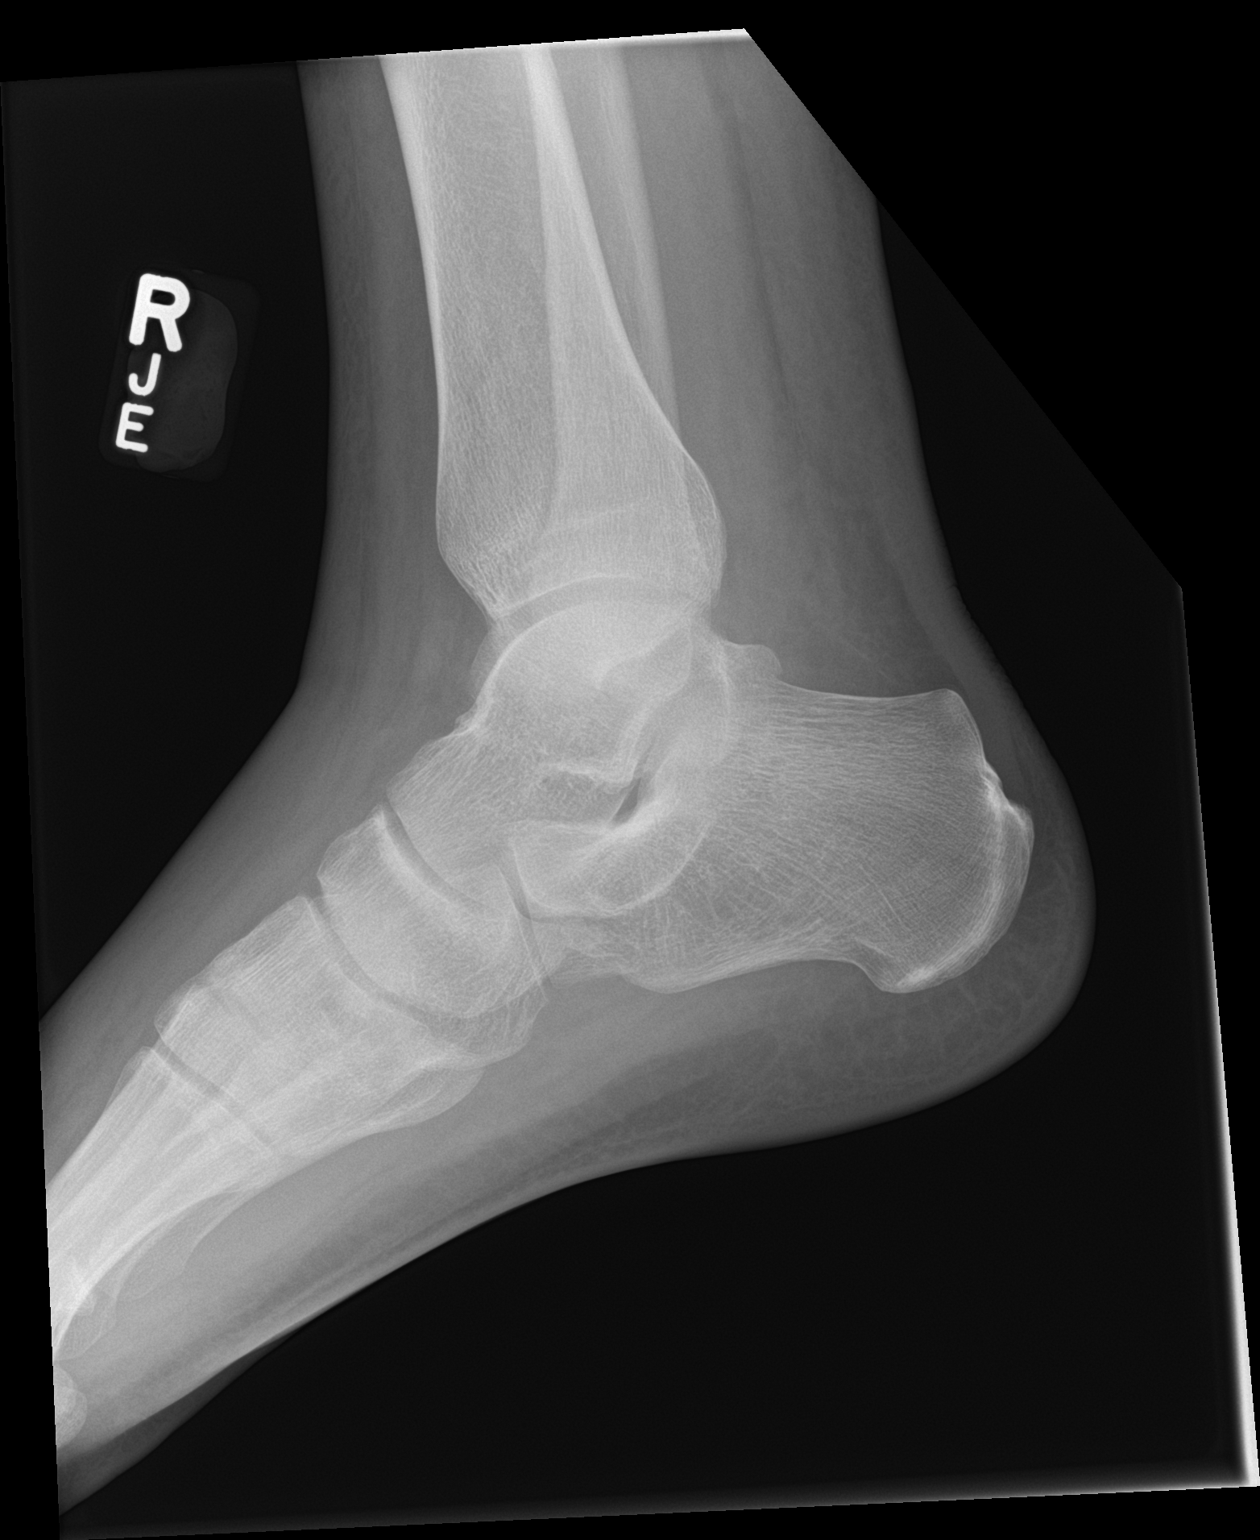

[3 of 3 positions shown; findings below may reference images not displayed]

FINDINGS: There is no evidence of fracture, dislocation, or joint effusion.
There is no evidence of arthropathy or other focal bone abnormality.
Soft tissues are swollen. No soft tissue gas or radiopaque foreign
body.
IMPRESSION: Soft tissue swelling.  Otherwise negative.

## 2022-11-29 ENCOUNTER — Emergency Department (HOSPITAL_BASED_OUTPATIENT_CLINIC_OR_DEPARTMENT_OTHER)
Admission: EM | Admit: 2022-11-29 | Discharge: 2022-11-29 | Disposition: A | Discharge status: Home / Self Care | Payer: BC Managed Care – PPO | Attending: Emergency Medicine | Admitting: Emergency Medicine

## 2022-11-29 ENCOUNTER — Encounter (HOSPITAL_BASED_OUTPATIENT_CLINIC_OR_DEPARTMENT_OTHER): Payer: Self-pay

## 2022-11-29 ENCOUNTER — Emergency Department (HOSPITAL_BASED_OUTPATIENT_CLINIC_OR_DEPARTMENT_OTHER): Payer: BC Managed Care – PPO

## 2022-11-29 ENCOUNTER — Other Ambulatory Visit: Payer: Self-pay

## 2022-11-29 DIAGNOSIS — W208XXA Other cause of strike by thrown, projected or falling object, initial encounter: Secondary | ICD-10-CM | POA: Insufficient documentation

## 2022-11-29 DIAGNOSIS — Y990 Civilian activity done for income or pay: Secondary | ICD-10-CM | POA: Diagnosis not present

## 2022-11-29 DIAGNOSIS — S6991XA Unspecified injury of right wrist, hand and finger(s), initial encounter: Secondary | ICD-10-CM | POA: Diagnosis present

## 2022-11-29 DIAGNOSIS — S60221A Contusion of right hand, initial encounter: Secondary | ICD-10-CM | POA: Insufficient documentation

## 2022-11-29 MED ORDER — IBUPROFEN 400 MG PO TABS
600.0000 mg | ORAL_TABLET | Freq: Once | ORAL | Status: AC
  Administered 2022-11-29: 600 mg via ORAL
  Filled 2022-11-29: qty 1

## 2022-11-29 NOTE — ED Provider Notes (Signed)
[place] EMERGENCY DEPARTMENT AT [facility] Provider Note   CSN: [id number] Arrival date & time: 11/29/22  0347     History Chief Complaint  Patient presents with   Hand Injury    Arthur Mills is a 32 y.o. male patient who presents to the emergency department today for further evaluation of right hand pain.  Patient states that he was at work when a very large and heavy box weighing around 300 pounds fell on his hand.  He is having difficulty moving the right fifth finger due to pain and swelling.  He denies any other injury.   Hand Injury      Home Medications Prior to Admission medications   Medication Sig Start Date End Date Taking? Authorizing Provider  doxycycline (VIBRAMYCIN) 100 MG capsule Take 1 capsule (100 mg total) by mouth 2 (two) times daily. 07/03/19   Petrucelli, Samantha R, PA-C  doxycycline (VIBRAMYCIN) 100 MG capsule Take 1 capsule (100 mg total) by mouth 2 (two) times daily. 11/15/20   Hyman Hopes, Margaux, PA-C  naproxen (NAPROSYN) 500 MG tablet Take 1 tablet (500 mg total) by mouth 2 (two) times daily. 07/03/19   Petrucelli, Pleas Koch, PA-C      Allergies    Isovue [iopamidol]    Review of Systems   Review of Systems  All other systems reviewed and are negative.   Physical Exam Updated Vital Signs BP (!) 141/90 (BP Location: Right Arm)   Pulse 93   Temp 98.3 F (36.8 C) (Oral)   Resp 18   Ht 6' (1.829 m)   Wt 104.3 kg   SpO2 97%   BMI 31.19 kg/m  Physical Exam Vitals and nursing note reviewed.  Constitutional:      Appearance: Normal appearance.  HENT:     Head: Normocephalic and atraumatic.  Eyes:     General:        Right eye: No discharge.        Left eye: No discharge.     Conjunctiva/sclera: Conjunctivae normal.  Pulmonary:     Effort: Pulmonary effort is normal.  Musculoskeletal:     Comments: Dorsal aspect of the right hand is swollen and tender to palpation primarily on the lateral aspect.  There is a 2+ radial pulse  felt in the right hand with good cap refill.  Patient has good sensation although limited range of motion secondary to pain.  Skin:    General: Skin is warm and dry.     Findings: No rash.  Neurological:     General: No focal deficit present.     Mental Status: He is alert.  Psychiatric:        Mood and Affect: Mood normal.        Behavior: Behavior normal.     ED Results / Procedures / Treatments   Labs (all labs ordered are listed, but only abnormal results are displayed) Labs Reviewed - No data to display  EKG None  Radiology DG Hand Complete Right  Result Date: 11/29/2022 CLINICAL DATA:  425956 Hand pain 116019 EXAM: RIGHT HAND - COMPLETE 3+ VIEW COMPARISON:  None Available. FINDINGS: Bone mineralization within normal limits for patient's age. No acute fracture or dislocation. No aggressive osseous lesion. No significant arthritis of the imaged joints. No radiopaque foreign bodies. Soft tissues are within normal limits. IMPRESSION: Negative. Electronically Signed   By: Jules Schick M.D.   On: 11/29/2022 10:03    Procedures Procedures    Medications Ordered in  ED Medications  ibuprofen (ADVIL) tablet 600 mg (600 mg Oral Given 11/29/22 0945)    ED Course/ Medical Decision Making/ A&P Clinical Course as of 11/29/22 1023  Thu Nov 29, 2022  1010 DG Hand Complete Right Personally ordered and interpreted the study and do not see any evidence of fracture in the hand.  I do agree with the radiologist interpretation. [CF]    Clinical Course User Index [CF] Teressa Lower, PA-C   {   Click here for ABCD2, HEART and other calculators  Medical Decision Making Arthur Mills is a 32 y.o. male patient who presents to the emergency department today for further evaluation of right hand pain.  There is some tenderness and swelling over the dorsal aspect of the right hand.  Will plan to get imaging to further evaluate for fracture.  There is no evidence of crush injury and I have a  low suspicion for compartment syndrome right now the hand.  I will plan to give him some ibuprofen and will reassess after imaging results.  Imaging was normal.  I will have him follow-up with his primary care doctor hide evaluation.  Strict return precautions were discussed.  He is safe for discharge.   Amount and/or Complexity of Data Reviewed Radiology: ordered.    Final Clinical Impression(s) / ED Diagnoses Final diagnoses:  Contusion of right hand, initial encounter    Rx / DC Orders ED Discharge Orders     None         Jolyn Lent 11/29/22 1025    Alvira Monday, MD 11/29/22 2223

## 2022-11-29 NOTE — Discharge Instructions (Addendum)
Imaging was reassuring.  There is no signs of fractures in the hand.  This is likely just soft tissue swelling and bruising.  I would stick with 600 mg of ibuprofen every 6 hours as needed for pain.  You received ibuprofen here as you should be good for some time.  Ice the area.  I will write you out for work for tomorrow.  Please follow-up with your primary care doctor.  You may return to the emergency room for any worsening symptoms.

## 2022-11-29 NOTE — ED Triage Notes (Signed)
Pt presents pov with complaints of right hand pain & swelling. Pt states he was moving some boxes and fell on his hand. Denies any other complaints.
